# Patient Record
Sex: Male | Born: 1945 | Race: White | Hispanic: No | Marital: Married | Smoking: Former smoker
Health system: Southern US, Community
[De-identification: ages and names within clinical notes are randomized; demographics above are authoritative.]

## PROBLEM LIST (undated history)

## (undated) DIAGNOSIS — D0362 Melanoma in situ of left upper limb, including shoulder: Secondary | ICD-10-CM

## (undated) DIAGNOSIS — C44320 Squamous cell carcinoma of skin of unspecified parts of face: Secondary | ICD-10-CM

## (undated) DIAGNOSIS — D039 Melanoma in situ, unspecified: Secondary | ICD-10-CM

## (undated) DIAGNOSIS — C4492 Squamous cell carcinoma of skin, unspecified: Secondary | ICD-10-CM

## (undated) DIAGNOSIS — D229 Melanocytic nevi, unspecified: Secondary | ICD-10-CM

## (undated) HISTORY — PX: APPENDECTOMY: SHX54

---
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
## 1996-06-04 DIAGNOSIS — D229 Melanocytic nevi, unspecified: Secondary | ICD-10-CM

## 1996-06-04 HISTORY — DX: Melanocytic nevi, unspecified: D22.9

## 2000-02-22 ENCOUNTER — Encounter (INDEPENDENT_AMBULATORY_CARE_PROVIDER_SITE_OTHER): Payer: Self-pay | Admitting: Specialist

## 2000-02-22 ENCOUNTER — Ambulatory Visit (HOSPITAL_COMMUNITY): Admission: RE | Admit: 2000-02-22 | Discharge: 2000-02-22 | Payer: Self-pay | Admitting: *Deleted

## 2003-10-13 DIAGNOSIS — C4492 Squamous cell carcinoma of skin, unspecified: Secondary | ICD-10-CM

## 2003-10-13 DIAGNOSIS — D039 Melanoma in situ, unspecified: Secondary | ICD-10-CM

## 2003-10-13 HISTORY — DX: Squamous cell carcinoma of skin, unspecified: C44.92

## 2003-10-13 HISTORY — DX: Melanoma in situ, unspecified: D03.9

## 2004-05-30 DIAGNOSIS — D0362 Melanoma in situ of left upper limb, including shoulder: Secondary | ICD-10-CM

## 2004-05-30 HISTORY — DX: Melanoma in situ of left upper limb, including shoulder: D03.62

## 2007-07-16 ENCOUNTER — Ambulatory Visit (HOSPITAL_COMMUNITY): Admission: RE | Admit: 2007-07-16 | Discharge: 2007-07-16 | Payer: Self-pay | Admitting: Neurosurgery

## 2007-09-25 ENCOUNTER — Emergency Department (HOSPITAL_COMMUNITY): Admission: EM | Admit: 2007-09-25 | Discharge: 2007-09-25 | Payer: Self-pay | Admitting: Emergency Medicine

## 2007-10-15 ENCOUNTER — Encounter (INDEPENDENT_AMBULATORY_CARE_PROVIDER_SITE_OTHER): Payer: Self-pay | Admitting: Neurosurgery

## 2007-10-15 ENCOUNTER — Inpatient Hospital Stay (HOSPITAL_COMMUNITY): Admission: RE | Admit: 2007-10-15 | Discharge: 2007-10-18 | Payer: Self-pay | Admitting: Neurosurgery

## 2008-01-22 DIAGNOSIS — C4492 Squamous cell carcinoma of skin, unspecified: Secondary | ICD-10-CM

## 2008-01-22 HISTORY — DX: Squamous cell carcinoma of skin, unspecified: C44.92

## 2008-07-23 ENCOUNTER — Encounter: Admission: RE | Admit: 2008-07-23 | Discharge: 2008-07-23 | Payer: Self-pay | Admitting: Neurosurgery

## 2009-04-28 ENCOUNTER — Encounter: Admission: RE | Admit: 2009-04-28 | Discharge: 2009-04-28 | Payer: Self-pay | Admitting: Neurosurgery

## 2009-10-30 IMAGING — CR DG CHEST 2V
3 series · 3 of 3 positions shown · non-contrast
Comparison: None

CLINICAL DATA: History given of pituitary tumor.  History of
hypertension.  History of previous tobacco smoking.

CHEST - 2 VIEW

[view not recorded (1 of 3)]
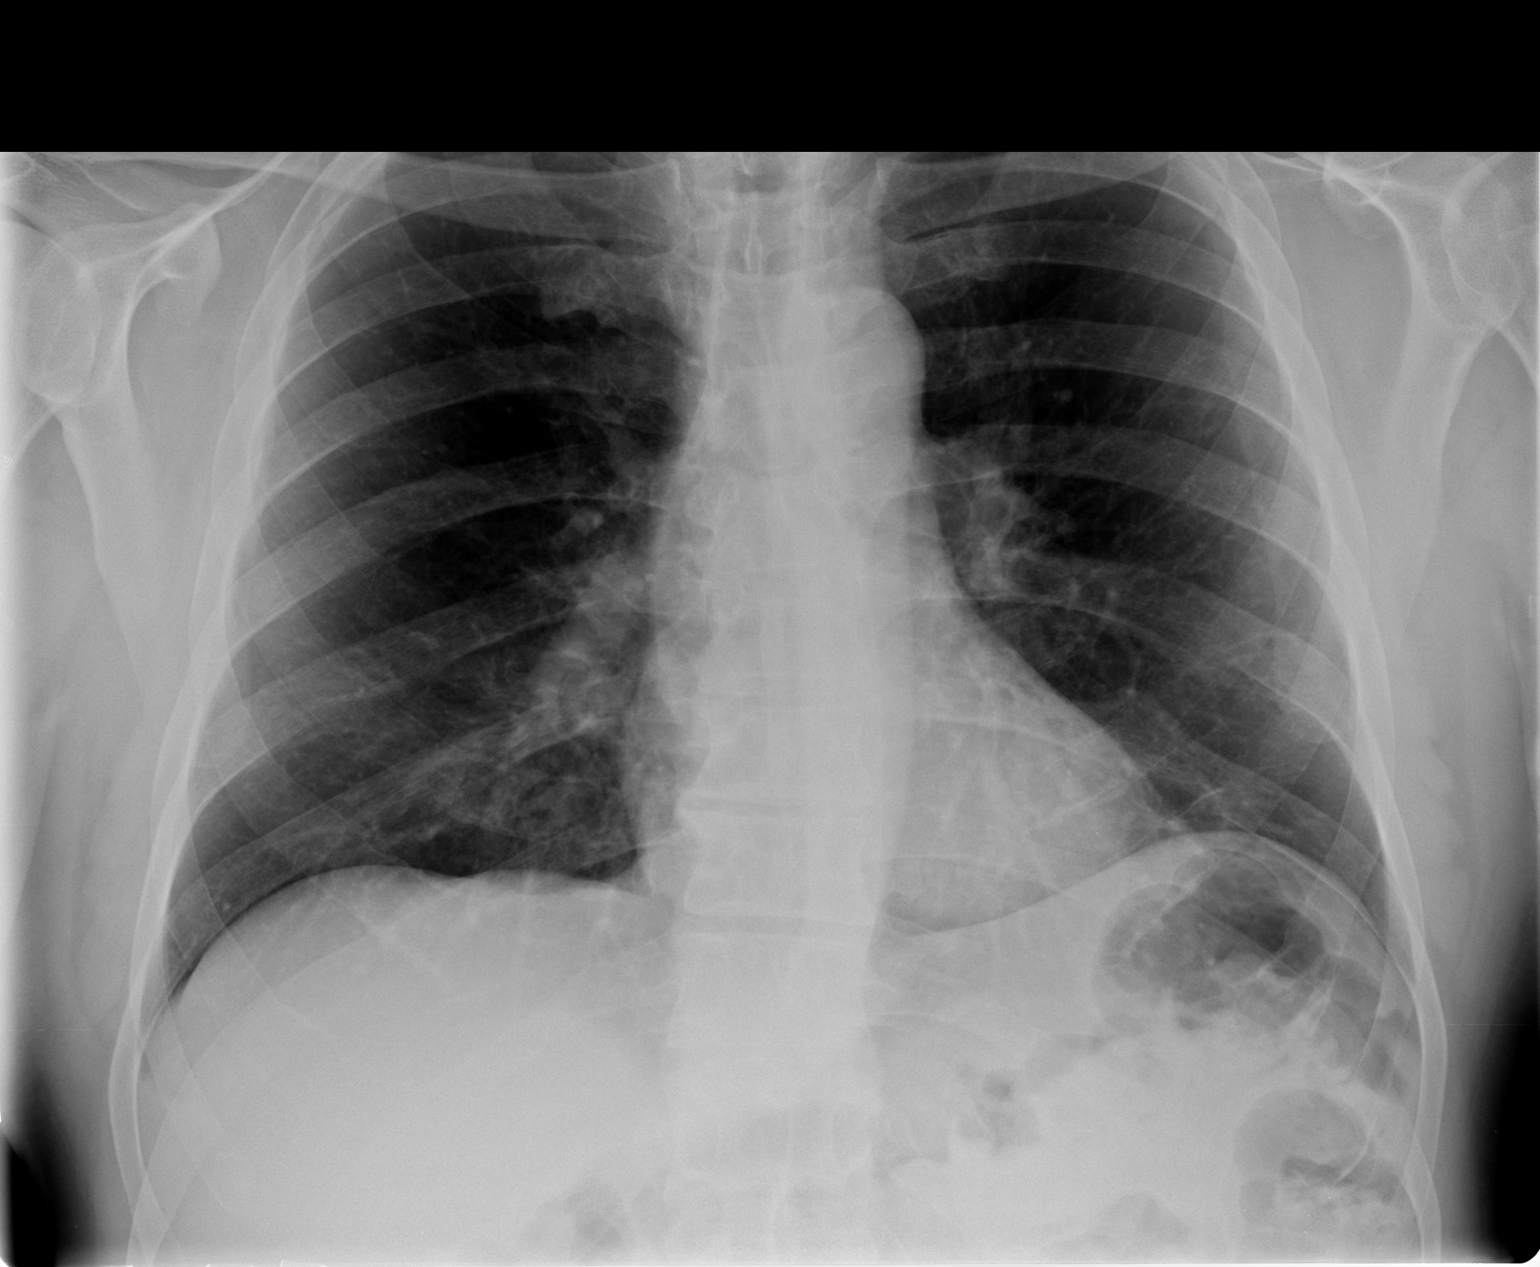

[view not recorded (2 of 3)]
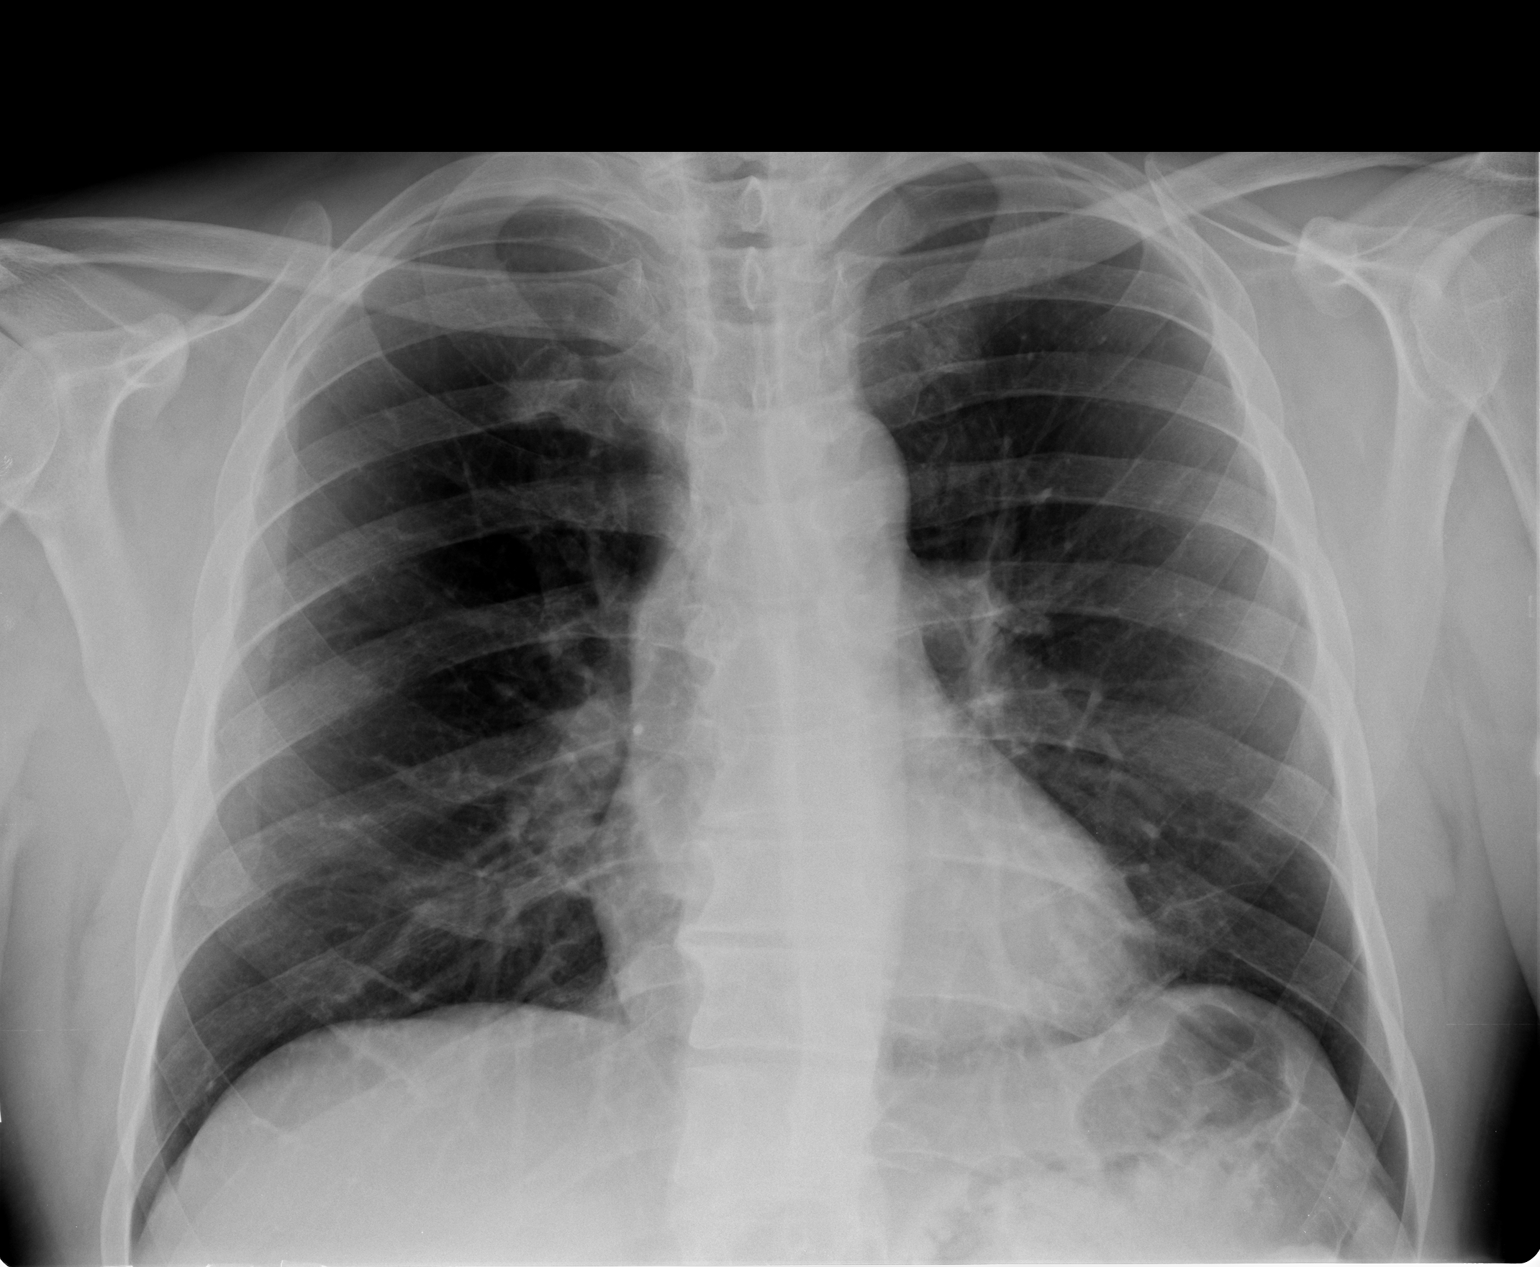

[view not recorded (3 of 3)]
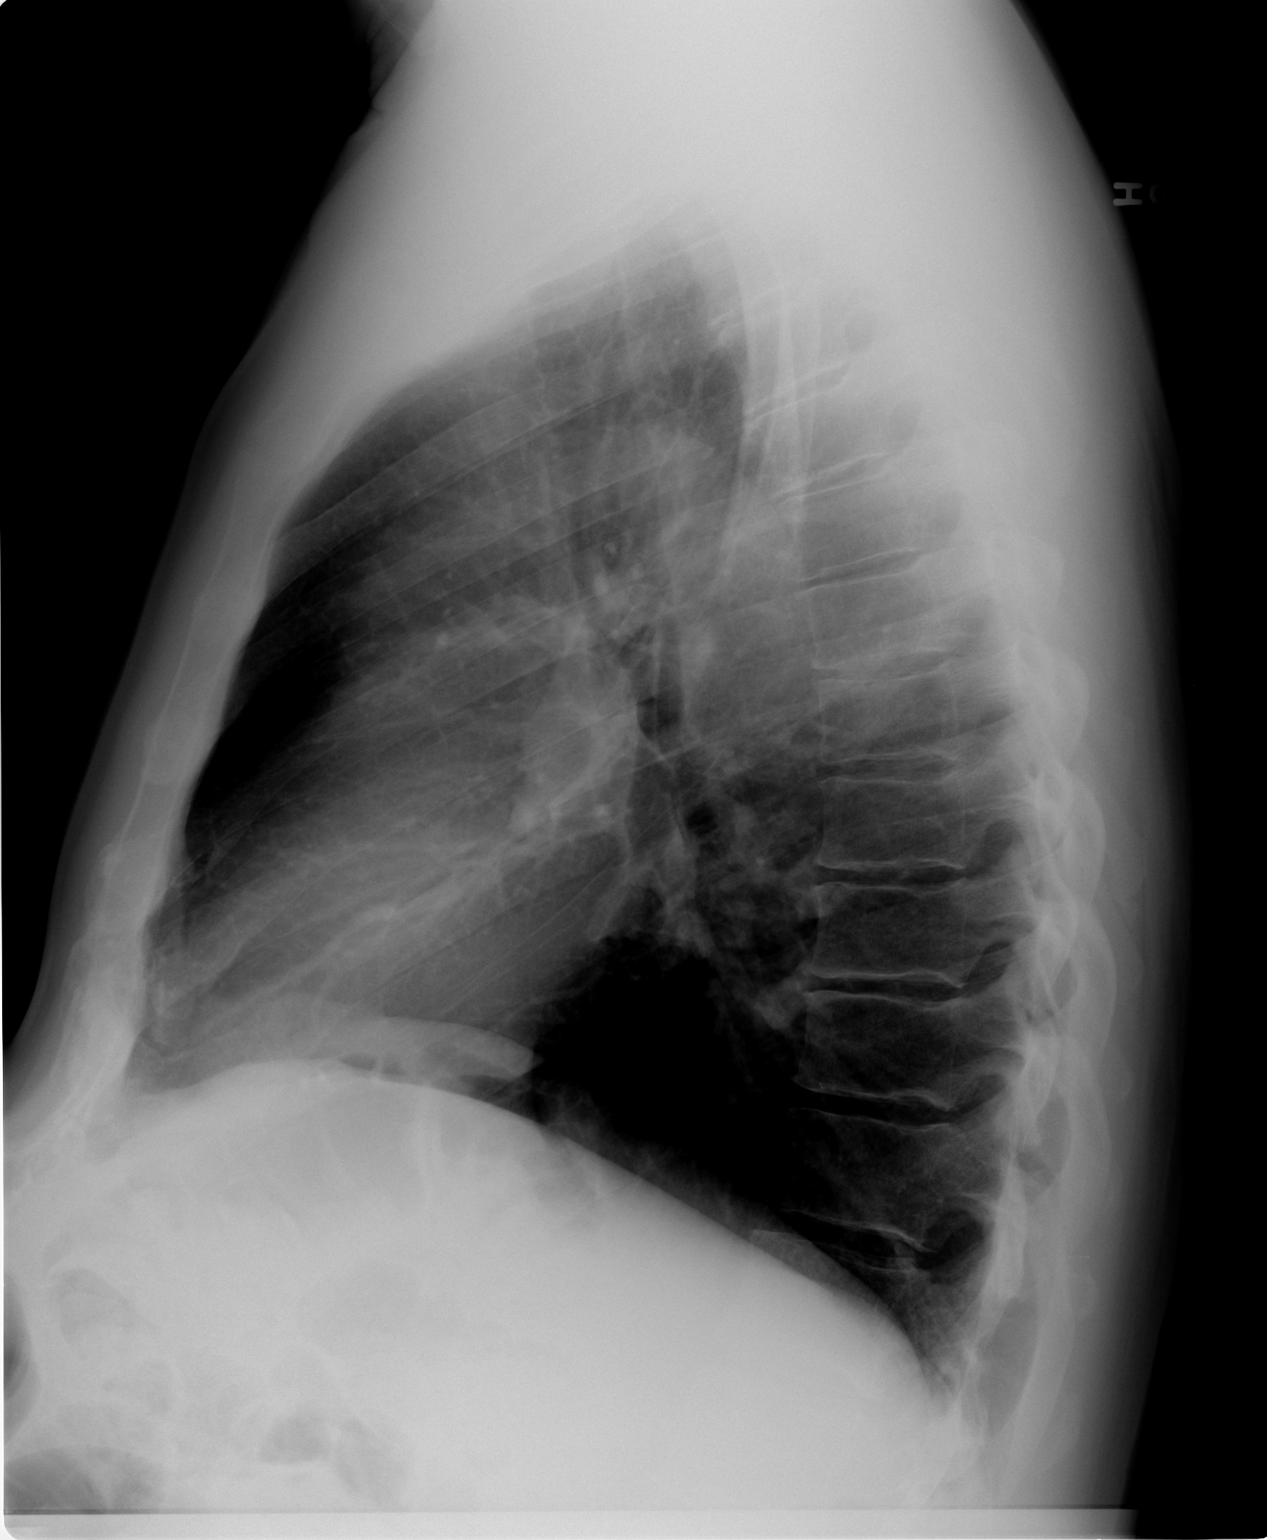

[3 of 3 positions shown; findings below may reference images not displayed]

FINDINGS: Cardiac silhouette is normal size and shape.  The lungs
are free of infiltrates.  No pleural disease is seen.  There is
minimal degenerative spondylosis.
IMPRESSION: No acute or active process seen.

## 2010-03-22 ENCOUNTER — Other Ambulatory Visit: Payer: Self-pay | Admitting: Dermatology

## 2010-06-27 NOTE — Op Note (Signed)
[name], Koch NO.:  [id number]   MEDICAL RECORD NO.:  [id number]          PATIENT TYPE:  INP   LOCATION:  3104                         FACILITY:  [facility]   PHYSICIAN:  Hewitt Shorts, M.D.DATE OF BIRTH:  Aug 10, 1945   DATE OF PROCEDURE:  10/15/2007  DATE OF DISCHARGE:  09/25/2007                               OPERATIVE REPORT   PREOPERATIVE DIAGNOSIS:  Pituitary cystic tumor.   POSTOPERATIVE DIAGNOSIS:  Pituitary cystic tumor.   PROCEDURE:  Transsphenoidal resection of pituitary cystic tumor with  microdissection and harvesting of a fat autograft from the abdominal  wall.   SURGEON:  Hewitt Shorts, MD   ASSISTANT:  Newman Pies, MD   ANESTHESIA:  General endotracheal.   INDICATIONS:  The patient is a 65 year old man who was found to have a  cystic pituitary tumor.  A serial MRI scan showed progressive  enlargement of the tumor with increasing encroachment to the cavernous  sinus bilaterally, right worse than left, as well as into the  suprasellar cistern with the mass abutting the optic chiasm and nerves.  Decision made to proceed with transsphenoidal resection of the tumor.   The procedure was done in a combine fashion between Dr. Suszanne Conners from the  ENT service and myself from the Neurosurgical service.  The approach and  closure was performed by Dr. Suszanne Conners, and the tumor resection was performed  by myself as well as the harvesting of the fat graft.   PROCEDURE:  The patient was brought to the operating room and placed  under general endotracheal anesthesia.  The patient was positioned on  the operating room table with his head resting in a horseshoe headrest  and tilted from right to left and slightly flexed.  This serum  fluoroscope was setup to use intraoperatively as well as the operating  microscope and Dr. Suszanne Conners used the endoscope during his approach.   Dr. Suszanne Conners performed the prep around the face and nares.  The right side  of the abdomen was  shaved and prepped with Betadine soap and solution  and draped in a sterile fashion.   The approach into the sphenoid sinus was performed by Dr. Suszanne Conners and is  dictated separately.  When I entered into the procedure, the operative  microscope was brought into the field and we visualized the sphenoid  sinus and then visualized the anterior wall of the sella turcica.  The  mucosa was stripped from the anterior wall and the bone was markedly  thinned and we were able to use a small micro bone curette to chip away  a small bit of bone and then use a variety of Kerrison punch to remove a  portion of the anterior wall of the sella.  The dura was exposed and we  took a 25-gauge spinal needle and passed it into the sella and gently  aspirated with a 1 mL syringe and aspirated xanthochromic fluid.  This  was sent to pathology for Cytology.   We then draped that area of the right side of the abdomen.  About 3-cm  incision was  made into the subcutaneous tissue, bipolar cautery,  electrocautery to maintain hemostasis and fat graft was harvested to  later be implanted in the area of the tumor resection.  That wound was  checked for hemostasis which was established with the use of cautery and  then the wound was closed.  The subcutaneous and subcuticular were  closed with inverted 2-0 Vicryl sutures.  The skin was closed with  Dermabond.   We then proceeded to open the dura in a cruciate-type fashion.  The cyst  was identified and then using a variety of micro ring curettes, we were  able to strip the lining of the pituitary cystic tumor from the walls of  the cyst and this specimen was sent to pathology in formalin for  permanent examination.  We saw the diaphragma sellae, which was intact,  but gently billowing into the sella.  Once all the walls of the cyst had  been striped of the cyst's lining, the area was irrigated with saline.  Hemostasis was confirmed and then we put a small piece of fat in  the  cystic cavity and then took a small piece of nasal bone, which was  generally positioned within the bone defect that we created along the  anterior wall of the sella and then Dr. Suszanne Conners took back over the surgical  procedure to perform the closure.  The procedure was tolerated well.  The estimated blood loss was 100 mL.  Sponge and needle count were  correct.  Once Dr. Suszanne Conners completes the closure, the patient will be  reversed from the anesthetic, to be extubated, and transferred to the  recovery room for further care.      Hewitt Shorts, M.D.  Electronically Signed     RWN/MEDQ  D:  10/15/2007  T:  10/16/2007  Job:  [id number]   cc:   Hewitt Shorts, M.D.  Newman Pies, MD

## 2010-06-27 NOTE — Discharge Summary (Signed)
[name], [name] NO.:  [id number]   MEDICAL RECORD NO.:  [id number]          PATIENT TYPE:  INP   LOCATION:  3027                         FACILITY:  [facility]   PHYSICIAN:  Hewitt Shorts, M.D.DATE OF BIRTH:  October 09, 1945   DATE OF ADMISSION:  10/15/2007  DATE OF DISCHARGE:  10/18/2007                               DISCHARGE SUMMARY   ADMISSION HISTORY AND PHYSICAL EXAMINATION:  The patient is a 65-year-  old man who is found to have a pituitary tumor.  It is an incidental  finding on an otologic workup, is a cystic lesion extending into the  suprasellar cistern and into the cavernous sinuses bilaterally right  greater than left.  He was followed initially with serial MRI scans, but  these scans showed progressive growth over a 7-9 month period of time  and a decision was made to admit the patient for transsphenoidal  resection of this cystic tumor.  This is to be done in the combined  fashion between Dr. Suszanne Conners of the ENT service and myself.  His exam, both  from a general perspective as well as neurologically was unremarkable  and he had no evidence of endocrinopathy.  Further details of his  admission history and physical examination is included in his admission  note.   HOSPITAL COURSE:  The patient was admitted and underwent transsphenoidal  resection of a cystic pituitary tumor, a small fat graft was used from  the abdomen, and the patient has done very nicely postoperatively.  He  has had no diabetes insipidus.  He was supported perioperatively with  hydrocortisone intravenously, that was tapered and he has tolerated the  tapering discontinuation of the hydrocortisone.  Postoperatively, he has  had no evidence of diabetes insipidus.  Electrolytes have been stable  and he is up, walking actively in the halls with essentially no  discomfort or pain.  His wound is healing nicely with small amount of  drainage, which he is able to manage.  He is scheduled to  follow up with  Dr. Suszanne Conners for suture removal in 4-5 days and he is to return to see me in  the office in about 3 weeks.  Pathology report describes a pituitary  adenoma.   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   Discharge prescription was given by Dr. Suszanne Conners for Keflex to be taken  daily until the nasal splints are removed by Dr. Suszanne Conners.  No other  prescriptions were given.  He is to resume his usual home medications  other than for aspirin, which is to be resumed after the sutures were  removed by Dr. Suszanne Conners.  He is to use Tylenol p.r.n. for pain.      Hewitt Shorts, M.D.  Electronically Signed     RWN/MEDQ  D:  10/18/2007  T:  10/18/2007  Job:  [id number]   cc:   Newman Pies, MD

## 2010-06-27 NOTE — Op Note (Signed)
[name], [name]                 ACCOUNT NO.:  [id number]   MEDICAL RECORD NO.:  [id number]          PATIENT TYPE:  INP   LOCATION:  3104                         FACILITY:  [facility]   PHYSICIAN:  Newman Pies, MD            DATE OF BIRTH:  1945-12-16   DATE OF PROCEDURE:  10/15/2007  DATE OF DISCHARGE:  09/25/2007                               OPERATIVE REPORT   SURGEON:  Newman Pies, MD (in conjunction with Dr. Shirlean Kelly).   PREOPERATIVE DIAGNOSIS:  Pituitary tumor.   POSTOPERATIVE DIAGNOSIS:  Pituitary tumor.   PROCEDURE PERFORMED:  Transsphenoidal resection of pituitary tumor.   ANESTHESIA:  General endotracheal tube anesthesia.   COMPLICATIONS:  None.   ESTIMATED BLOOD LOSS:  100 mL.   INDICATION FOR PROCEDURE:  Mr. Shahin Knierim is a 65 year old white male  with a one-year history of pituitary tumor.  Over the past year, the  tumor was noted to increase in size significantly.  Based on that  findings, the decision was made to surgically remove the enlarging  pituitary tumor.  The risks, benefits, alternatives, and details of the  procedure were reviewed with the patient and his wife.  Questions were  invited and answered.  Informed consent was obtained.   DESCRIPTION:  The patient was taken to the operating room and placed  supine on the operating table.  General endotracheal tube anesthesia was  administered by the anesthesiologist.  Preop IV antibiotic was given.  The patient was then positioned and prepped and draped in the standard  fashion for transseptal/transsphenoidal approach to the pituitary tumor.  Pledgets soaked with Afrin were placed in both nasal cavities for  vasoconstriction.  Lidocaine 1% with 1:100,000 epinephrine were injected  onto the nasal septum bilaterally.  A standard rhinoplasty external  incision was made.  The medial crura of the lower lateral cartilage were  identified and separated at midline.  The nasal septum was subsequently  identified.  The  submucosal perichondrial flap was then elevated on the  left side.  The mucosal flap was then dissected posteriorly, until the  vomer and ethmoid bones were identified.  The subperiosteal flap was  also elevated in the standard fashion.  The bony cartilaginous junction  was subsequently disarticulated.  The ethmoid bone was removed.  A  portion of the septal cartilage was also removed.  The vomer bone was  left in place as a landmark for the midline.  The mucosal flap overlying  the anterior sphenoid wall was subsequently elevated with the Market researcher.  The sphenoid opening was enlarged bilaterally.  The entire  sphenoid cavity was subsequently visualized.  The intersphenoidal septum  was taken down with a rongeur.  At this time, the sella turcica was  visualized.  Visualization of the sella turcica was improved by removing  the entire anterior sphenoidal wall.  A Hardy retractor was then used to  improve the exposure to the sella turcica.  At this time, the care of  the patient was turned over to Dr. Newell Coral.  Please see Dr. Earl Gala  dictation note for details on the tumor excision portion of the case.  Upon completion of the tumor excision, the sphenoid sinus was noted to  be free of any bleeding or CSF leak.  The [facility] retractor was removed.  The previously harvested septal cartilage was replaced.  The septal  mucosal flaps were then quilted with 4-0 plain gut suture on a Keith  needle.  The medial crura of the lower lateral nasal cartilages were  sutured in place with 4-0 chromic sutures.  The rhinoplasty columella  flap was returned to its normal anatomic position and was closed with 5-  0 Prolene sutures.  Doyle splint was subsequently placed and sutured in  place with a 2-0 Prolene suture.  That concluded the procedure for the  patient.  The care of the patient was turned over to the  anesthesiologist.  The patient was awakened from anesthesia without  difficulty.  He was  transferred to the recovery room in good condition.   OPERATIVE FINDINGS:  The Doyle splint will be left in place for 1 week.  The patient will be observed overnight in the Neuro Intensive Care Unit.      Newman Pies, MD  Electronically Signed     ST/MEDQ  D:  10/15/2007  T:  10/16/2007  Job:  [id number]

## 2010-06-27 NOTE — H&P (Signed)
NAMEWENDLE, [name] NO.:  [id number]   MEDICAL RECORD NO.:  [id number]          PATIENT TYPE:  INP   LOCATION:  3104                         FACILITY:  [facility]   PHYSICIAN:  Hewitt Shorts, M.D.DATE OF BIRTH:  1945/03/20   DATE OF ADMISSION:  10/15/2007  DATE OF DISCHARGE:                              HISTORY & PHYSICAL   HISTORY OF PRESENT ILLNESS:  The patient is a 65 year old right-handed  white male who was evaluated for a pituitary mass.  He had hearing loss  following his left ear  being struck by a tennis ball while playing  doubles.  He underwent MRI by his otologist which found pituitary mass  and the patient was evaluated for this.  He denies any visual  alteration, fatigue, tiredness, weight loss, and skin bruising.  His  weight has been stable.  He has had some erectile dysfunction with  asymmetric engorgement of the penis with erection, as such the right  side engorged and the left side does not and therefore we gave  anesthesia to the left.  He had no evaluation for this by his urologist  or primary physician.   We performed an endocrinologic workup and found no endocrinopathy.  We  followed the patient with serial MRI scans.  Those revealed progressive  enlargement of this pituitary mass, appears cystic with peripheral  enhancement and is now abutting the optic chiasm extending into the  cavernous sinuses bilaterally, right worse than left.  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  We did  obtain a CT angiogram to rule out a cerebral aneurysm and the CT  angiogram showed no evidence of aneurysm.   The patient is admitted now for a transsphenoidal resection of his  pituitary mass.   PAST MEDICAL HISTORY:  Notable for history of hypertension,  hypercholesterolemia, and melanoma.  No history of myocardial  infarction, stroke, diabetes, peptic ulcer disease, or lung disease.   PREVIOUS SURGICAL  HISTORY:  Tonsillectomy in 1931, and resection of  multiple melanomas in 2006 and 2007.   He denies allergies to medications.   CURRENT MEDICATIONS:  1. Diovan/hydrochlorothiazide 80/12.5 daily.  2. Simvastatin 20 daily.  3. Aspirin 81 mg daily.   FAMILY HISTORY:  Parents passed on in elderly age.   SOCIAL HISTORY:  The patient is a retired Consulting civil engineer for  [employer].  He is married.  He does not smoke, he  has occasional glass of beer or wine on most days.  He denies history of  substance abuse.   REVIEW OF SYSTEMS:  Notable for those described in the history of  present illness and past medical history, but otherwise is unremarkable.   PHYSICAL EXAMINATION:  GENERAL:  The patient is a well-developed, well-  nourished white male in no acute distress.  VITAL SIGNS:  Temp is 97.9, pulse 55, blood pressure 147/93, respiratory  rate 18, height 6 feet, and weight 96 kg.  LUNGS:  Clear to auscultation.  He has symmetrical respiratory  excursion.  HEART:  Regular rate and  rhythm.  Normal S1 and S2.  There is no murmur.  ABDOMEN:  Soft and nondistended, bowel sounds are present.  EXTREMITIES:  No clubbing, cyanosis, or edema.  NEUROLOGIC:  The patient to be awake, alert, and fully oriented.  Speech  is fluent.  He has good comprehension.  Cranial nerves show pupils are  equal, round, and reactive to light.  Extraocular movements are intact.  Visual fields are intact to confrontation with a red target.  Facial  movement is symmetrical.  Facial sensation is intact.  Motor examination  shows 5/5 strength to the upper and lower extremities.  Sensation is  intact to pinprick in the upper and lower extremities.  Reflexes were  absent in the biceps, brachioradialis, triceps, quadriceps, and  gastrocnemius.  They are symmetrical bilaterally.  Toes are downgoing  bilaterally.  He has a normal gait and stance.   IMPRESSION:  Enlarging pituitary mass that appears  cystic.  He does not  have evidence of endocrinopathy.  The patient is admitted now for a  transsphenoidal resection of this pituitary mass in a combined fashion  between the ENT Service, Dr. Suszanne Conners, and myself from the Neurosurgical  Service.   PLAN:  The patient admitted for transsphenoidal resection of a pituitary  tumor in a combined fashion between the Neurosurgical and ENT Services.  We discussed the nature of this lesion, the nature of surgery, typical  length of surgery, hospital stay, and overall recuperation.  Need for  postoperative care in the intensive care unit and risk surgery including  risk of infection, including meningitis and/or sinusitis, bleeding,  possibly transfusion.  The risk is neurologic dysfunction including loss  of vision, paralysis, and death.  The risk of endocrinopathy possibly  for long term/lifetime pituitary hormone replacement and anesthetic  risk, myocardial infarction, stroke, pneumonia, and death.  We also  discussed the risk of occurrence of this lesion and possibly further  surgery, radiation therapy, or medical therapy.  Understanding all this,  he does wish to go ahead with surgery and is admitted for such.      Hewitt Shorts, M.D.  Electronically Signed     RWN/MEDQ  D:  10/15/2007  T:  10/15/2007  Job:  [id number]   cc:   Newman Pies, MD

## 2010-07-28 ENCOUNTER — Other Ambulatory Visit: Payer: Self-pay | Admitting: Neurosurgery

## 2010-07-28 DIAGNOSIS — D353 Benign neoplasm of craniopharyngeal duct: Secondary | ICD-10-CM

## 2010-08-23 ENCOUNTER — Ambulatory Visit
Admission: RE | Admit: 2010-08-23 | Discharge: 2010-08-23 | Disposition: A | Discharge status: Home / Self Care | Payer: Medicare Other | Source: Ambulatory Visit | Attending: Neurosurgery | Admitting: Neurosurgery

## 2010-08-23 DIAGNOSIS — D352 Benign neoplasm of pituitary gland: Secondary | ICD-10-CM

## 2010-08-23 MED ORDER — GADOBENATE DIMEGLUMINE 529 MG/ML IV SOLN: 15.0000 mL | Freq: Once | INTRAVENOUS | Status: AC | PRN

## 2010-11-09 LAB — CREATININE, SERUM
Creatinine, Ser: 0.98
GFR calc Af Amer: >60
GFR calc non Af Amer: >60

## 2010-11-09 LAB — BUN: BUN: 17

## 2010-11-10 LAB — DIFFERENTIAL
Eosinophils Relative: 2
Lymphocytes Relative: 35
Neutrophils Relative %: 55

## 2010-11-10 LAB — CBC
HCT: 39.3
Hemoglobin: 13.2
MCHC: 33.7
MCV: 89.6
RBC: 4.39

## 2010-11-10 LAB — POCT I-STAT, CHEM 8
BUN: 13
Creatinine, Ser: 0.9
HCT: 38 — ABNORMAL LOW
Hemoglobin: 12.9 — ABNORMAL LOW
Potassium: 4.2

## 2010-11-15 LAB — BASIC METABOLIC PANEL
BUN: 10
BUN: 16
BUN: 16
CO2: 25
CO2: 25
CO2: 27
CO2: 27
Calcium: 8.6
Calcium: 8.7
Calcium: 8.8
Calcium: 9
Chloride: 104
Chloride: 106
Chloride: 107
Chloride: 109
Creatinine, Ser: 0.81
Creatinine, Ser: 0.95
Creatinine, Ser: 0.96
GFR calc Af Amer: >60
GFR calc Af Amer: >60
GFR calc Af Amer: >60
GFR calc Af Amer: >60
GFR calc non Af Amer: >60
GFR calc non Af Amer: >60
GFR calc non Af Amer: >60
GFR calc non Af Amer: >60
Glucose, Bld: 106 — ABNORMAL HIGH
Glucose, Bld: 130 — ABNORMAL HIGH
Glucose, Bld: 152 — ABNORMAL HIGH
Glucose, Bld: 160 — ABNORMAL HIGH
Potassium: 3.5
Potassium: 3.5
Potassium: 3.7
Sodium: 139
Sodium: 139
Sodium: 140
Sodium: 141

## 2013-02-24 ENCOUNTER — Other Ambulatory Visit: Payer: Self-pay | Admitting: Dermatology

## 2013-07-01 ENCOUNTER — Encounter (INDEPENDENT_AMBULATORY_CARE_PROVIDER_SITE_OTHER): Payer: Self-pay | Admitting: General Surgery

## 2013-07-01 ENCOUNTER — Ambulatory Visit (INDEPENDENT_AMBULATORY_CARE_PROVIDER_SITE_OTHER): Payer: Medicare Other | Admitting: General Surgery

## 2013-07-01 VITALS — BP 162/88 | HR 76 | Temp 97.5°F | Resp 12 | Ht 72.0 in | Wt 198.8 lb

## 2013-07-01 DIAGNOSIS — M6200 Separation of muscle (nontraumatic), unspecified site: Secondary | ICD-10-CM

## 2013-07-01 DIAGNOSIS — M6208 Separation of muscle (nontraumatic), other site: Secondary | ICD-10-CM | POA: Insufficient documentation

## 2013-07-01 NOTE — Progress Notes (Signed)
Patient ID: [name], male   DOB: 11-Sep-1945, 68 y.o.   MRN: [id number]  Chief Complaint  Patient presents with  . New Evaluation    eval ventral hernia    HPI [name] is a 68 y.o. male.  Chief complaint: possible hernia HPI Patient was noted by Dr. Einar Gip to have a possible abdominal wall hernia. He asked me to see him in consultation. The patient notices a bulge along his upper midline with activity. No pain. No GI symptoms. He is active and plays tennis. He has not had abdominal surgery there. History reviewed. No pertinent past medical history.  Past Surgical History  Procedure Laterality Date  . Appendectomy      History reviewed. No pertinent family history.  Social History History  Substance Use Topics  . Smoking status: Former Smoker    Quit date: 07/02/1967  . Smokeless tobacco: Former Systems developer    Quit date: 07/01/1969  . Alcohol Use: Yes     Comment: occ    No Known Allergies  Current Outpatient Prescriptions  Medication Sig Dispense Refill  . flecainide (TAMBOCOR) 50 MG tablet       . simvastatin (ZOCOR) 40 MG tablet       . valsartan (DIOVAN) 80 MG tablet        No current facility-administered medications for this visit.    Review of Systems Review of Systems  Constitutional: Negative.   HENT: Negative.   Eyes: Negative.   Respiratory: Negative.   Cardiovascular:       A fib  Gastrointestinal:       See HPI  Endocrine: Negative.   Genitourinary: Negative.   Musculoskeletal: Negative.   Skin: Negative.   Allergic/Immunologic: Negative.   Neurological: Negative.   Hematological: Negative.   Psychiatric/Behavioral: Negative.     Blood pressure 162/88, pulse 76, temperature 97.5 F (36.4 C), resp. rate 12, height 6' (1.829 m), weight 198 lb 12.8 oz (90.175 kg).  Physical Exam Physical Exam  Constitutional: He is oriented to person, place, and time. He appears well-developed and well-nourished.  HENT:  Head: Normocephalic and  atraumatic.  Eyes: EOM are normal. Pupils are equal, round, and reactive to light.  Neck: Normal range of motion. No tracheal deviation present.  Cardiovascular:  irreg irreg  Pulmonary/Chest: Effort normal and breath sounds normal. No stridor. No respiratory distress. He has no wheezes.  Abdominal: Soft. He exhibits no distension. There is no tenderness. There is no rebound and no guarding.  Upper midline rectus diastasis  Musculoskeletal: Normal range of motion.  Neurological: He is alert and oriented to person, place, and time.  Skin: Skin is warm and dry.    Data Reviewed Referral and notes from Dr. Einar Gip  Assessment    Rectus diastasis    Plan    This is not dangerous and is not a hernia per se. It is considered a cosmetic condition. He is not symptomatic. I advise wearing the brace that he uses for comfort. He does not want surgery at this time. If he changes hi mind, he will need to see a plastic surgeon as this is considered a cosmetic procedure.       Zenovia Jarred 07/01/2013, 9:42 AM

## 2015-03-01 DIAGNOSIS — C44320 Squamous cell carcinoma of skin of unspecified parts of face: Secondary | ICD-10-CM

## 2015-03-01 HISTORY — DX: Squamous cell carcinoma of skin of unspecified parts of face: C44.320

## 2015-08-25 DIAGNOSIS — I10 Essential (primary) hypertension: Secondary | ICD-10-CM | POA: Insufficient documentation

## 2015-08-25 DIAGNOSIS — E785 Hyperlipidemia, unspecified: Secondary | ICD-10-CM | POA: Insufficient documentation

## 2018-01-21 ENCOUNTER — Other Ambulatory Visit: Payer: Self-pay | Admitting: Dermatology

## 2018-08-05 ENCOUNTER — Other Ambulatory Visit: Payer: Self-pay | Admitting: Cardiology

## 2018-08-05 NOTE — Telephone Encounter (Signed)
Please fill

## 2018-09-25 ENCOUNTER — Other Ambulatory Visit: Payer: Self-pay | Admitting: Cardiology

## 2018-09-27 ENCOUNTER — Other Ambulatory Visit: Payer: Self-pay | Admitting: Cardiology

## 2019-01-26 ENCOUNTER — Other Ambulatory Visit: Payer: Self-pay | Admitting: Cardiology

## 2019-01-27 LAB — COMPREHENSIVE METABOLIC PANEL
ALT: 27 IU/L (ref 0–44)
AST: 25 IU/L (ref 0–40)
Albumin/Globulin Ratio: 2.2 (ref 1.2–2.2)
Albumin: 4.7 g/dL (ref 3.7–4.7)
Alkaline Phosphatase: 46 IU/L (ref 39–117)
BUN/Creatinine Ratio: 15 (ref 10–24)
BUN: 14 mg/dL (ref 8–27)
Bilirubin Total: 0.5 mg/dL (ref 0.0–1.2)
CO2: 24 mmol/L (ref 20–29)
Calcium: 9.6 mg/dL (ref 8.6–10.2)
Chloride: 103 mmol/L (ref 96–106)
Creatinine, Ser: 0.91 mg/dL (ref 0.76–1.27)
GFR calc Af Amer: 95 mL/min/{1.73_m2} (ref >59)
GFR calc non Af Amer: 82 mL/min/{1.73_m2} (ref >59)
Globulin, Total: 2.1 g/dL (ref 1.5–4.5)
Glucose: 95 mg/dL (ref 65–99)
Potassium: 4.7 mmol/L (ref 3.5–5.2)
Sodium: 139 mmol/L (ref 134–144)
Total Protein: 6.8 g/dL (ref 6.0–8.5)

## 2019-01-27 LAB — CBC WITH DIFFERENTIAL/PLATELET
Basophils Absolute: 0.1 10*3/uL (ref 0.0–0.2)
Basos: 1 %
EOS (ABSOLUTE): 0.1 10*3/uL (ref 0.0–0.4)
Eos: 1 %
Hematocrit: 43.2 % (ref 37.5–51.0)
Hemoglobin: 14.2 g/dL (ref 13.0–17.7)
Immature Grans (Abs): 0 10*3/uL (ref 0.0–0.1)
Immature Granulocytes: 0 %
Lymphocytes Absolute: 2.8 10*3/uL (ref 0.7–3.1)
Lymphs: 36 %
MCH: 29.7 pg (ref 26.6–33.0)
MCHC: 32.9 g/dL (ref 31.5–35.7)
MCV: 90 fL (ref 79–97)
Monocytes Absolute: 0.6 10*3/uL (ref 0.1–0.9)
Monocytes: 8 %
Neutrophils Absolute: 4.1 10*3/uL (ref 1.4–7.0)
Neutrophils: 54 %
Platelets: 292 10*3/uL (ref 150–450)
RBC: 4.78 x10E6/uL (ref 4.14–5.80)
RDW: 12.1 % (ref 11.6–15.4)
WBC: 7.7 10*3/uL (ref 3.4–10.8)

## 2019-01-27 LAB — LIPID PANEL W/O CHOL/HDL RATIO
Cholesterol, Total: 162 mg/dL (ref 100–199)
HDL: 71 mg/dL (ref >39)
LDL Chol Calc (NIH): 74 mg/dL (ref 0–99)
Triglycerides: 95 mg/dL (ref 0–149)
VLDL Cholesterol Cal: 17 mg/dL (ref 5–40)

## 2019-01-29 ENCOUNTER — Encounter: Payer: Self-pay | Admitting: Cardiology

## 2019-02-02 ENCOUNTER — Encounter: Payer: Self-pay | Admitting: Cardiology

## 2019-02-02 ENCOUNTER — Other Ambulatory Visit: Payer: Self-pay

## 2019-02-02 ENCOUNTER — Ambulatory Visit (INDEPENDENT_AMBULATORY_CARE_PROVIDER_SITE_OTHER): Payer: Medicare Other | Admitting: Cardiology

## 2019-02-02 VITALS — BP 172/88 | HR 62 | Temp 97.0°F | Ht 72.0 in | Wt 200.2 lb

## 2019-02-02 DIAGNOSIS — R9431 Abnormal electrocardiogram [ECG] [EKG]: Secondary | ICD-10-CM

## 2019-02-02 DIAGNOSIS — E7800 Pure hypercholesterolemia, unspecified: Secondary | ICD-10-CM

## 2019-02-02 DIAGNOSIS — I10 Essential (primary) hypertension: Secondary | ICD-10-CM

## 2019-02-02 DIAGNOSIS — Z8679 Personal history of other diseases of the circulatory system: Secondary | ICD-10-CM

## 2019-02-02 MED ORDER — VALSARTAN-HYDROCHLOROTHIAZIDE 160-12.5 MG PO TABS: 1.0000 | ORAL_TABLET | ORAL | 2 refills | Status: DC

## 2019-02-02 NOTE — Progress Notes (Signed)
Primary Physician/Referring:  Christain Sacramento, MD  Patient ID: [name], male    DOB: 1945/09/18, 73 y.o.   MRN: [id number]  Chief Complaint  Patient presents with  . Hypertension  . Hyperlipidemia  . Atrial Fibrillation   HPI:    [name]  is a 73 y.o. Caucasian male with  h/o remote A. Fibrillation  In 2013 without recurrence and maintains sinus on Flecainide 50 mg BID chronically. (converted to sinus on Flecainide and metoprolol and discontinued excessive coffee drinking). He reports he is doing very well, staying very active and playing tennis mulitple times per week. He has not had recurrence of Afib since using flecainide on low dose. He denies any more episodes of palpitations and maintains a high energy level.   He denies any chest pain, shortness of breath, PND, orthopnea, pedal edema, or syncope. His main concern today is uncontrolled blood pressure recently.  Otherwise remains asymptomatic.   Past Medical History:  Diagnosis Date  . 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 Atypical nevus 03/22/1998   moderate atypia - upper mid back (widershave)  . Atypical nevus 03/14/1999   slight atypia - left flank  . Atypical nevus 01/01/2006   moderate atypia - left outer scapula  . Atypical nevus 02/03/2007   slight atypia - left chest  . Atypical nevus 06/02/2008   atypical proliferation - left scapula - excision  . 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 Squamous cell carcinoma of skin 10/13/2003   right cheek - treated10/07/2003  . Squamous cell carcinoma of skin 06/05/2005   left collarbone - MOHs  . Squamous cell carcinoma of skin  12/15/2008   top right ear - CX3 + 5FU  . 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 APPENDECTOMY     Social History   Socioeconomic History  . Marital status: Married    Spouse name: Not on file  . Number of children: 0  . Years of education: Not on file  . Highest education level: Not on file  Occupational History  . Not on file  Tobacco Use  . Smoking status: Former Smoker    Packs/day: 1.00    Years: 17.00    Pack years: 17.00    Types: Cigarettes    Quit date: 07/02/1967    Years since quitting: 51.6  . Smokeless tobacco: Former Systems developer    Quit date: 07/01/1969  Substance and Sexual Activity  . Alcohol use: Yes    Comment: occ  . Drug use: No  . Sexual activity: Not on file  Other Topics Concern  . Not on file  Social History Narrative  . Not on file   Social Determinants of Health   Financial Resource Strain:   . Difficulty of Paying Living Expenses: Not on file  Food Insecurity:   . Worried About Charity fundraiser in the Last Year: Not on file  .  Ran Out of Food in the Last Year: Not on file  Transportation Needs:   . Lack of Transportation (Medical): Not on file  . Lack of Transportation (Non-Medical): Not on file  Physical Activity:   . Days of Exercise per Week: Not on file  . Minutes of Exercise per Session: Not on file  Stress:   . Feeling of Stress : Not on file  Social Connections:   . Frequency of Communication with Friends and Family: Not on file  . Frequency of Social Gatherings with Friends and Family: Not on file  . Attends Religious Services: Not on file  . Active Member of Clubs or Organizations: Not on file  . Attends Archivist Meetings: Not on file  . Marital Status: Not on file  Intimate Partner Violence:   . Fear of Current or Ex-Partner: Not on file  . Emotionally  Abused: Not on file  . Physically Abused: Not on file  . Sexually Abused: Not on file   ROS  Review of Systems  Constitution: Negative for chills, decreased appetite, malaise/fatigue and weight gain.  Cardiovascular: Negative for dyspnea on exertion, leg swelling and syncope.  Endocrine: Negative for cold intolerance.  Hematologic/Lymphatic: Does not bruise/bleed easily.  Musculoskeletal: Negative for joint swelling.  Gastrointestinal: Negative for abdominal pain, anorexia, change in bowel habit, hematochezia and melena.  Neurological: Negative for headaches and light-headedness.  Psychiatric/Behavioral: Negative for depression and substance abuse.  All other systems reviewed and are negative.  Objective  Blood pressure (!) 172/88, pulse 62, temperature (!) 97 F (36.1 C), height 6' (1.829 m), weight 200 lb 3.2 oz (90.8 kg), SpO2 99 %.  Vitals with BMI 02/02/2019 07/01/2013  Height _0  _1   Weight 200 lbs 3 oz 198 lbs 13 oz  BMI 95.09 27  Systolic 326 712  Diastolic 88 88  Pulse 62 76     Physical Exam  HENT:  Head: Atraumatic.  Eyes: Conjunctivae are normal.  Neck: No JVD present. No thyromegaly present.  Cardiovascular: Normal rate, regular rhythm, normal heart sounds and intact distal pulses. Exam reveals no gallop.  No murmur heard. No leg edema, no JVD.  Pulmonary/Chest: Effort normal and breath sounds normal.  Abdominal: Soft. Bowel sounds are normal.  Musculoskeletal:        General: Normal range of motion.     Cervical back: Neck supple.  Neurological: He is alert.  Skin: Skin is warm and dry.  Psychiatric: He has a normal mood and affect.   Laboratory examination:   Recent Labs    01/26/19 1119  NA 139  K 4.7  CL 103  CO2 24  GLUCOSE 95  BUN 14  CREATININE 0.91  CALCIUM 9.6  GFRNONAA 82  GFRAA 95   estimated creatinine clearance is 77 mL/min (by C-G formula based on SCr of 0.91 mg/dL).  CMP Latest Ref Rng & Units 01/26/2019 10/18/2007 10/17/2007    Glucose 65 - 99 mg/dL 95 106(H) 130(H)  BUN 8 - 27 mg/dL _2 Creatinine 0.76 - 1.27 mg/dL 0.91 0.81 0.96  Sodium 134 - 144 mmol/L 139 141 140  Potassium 3.5 - 5.2 mmol/L 4.7 3.7 3.5  Chloride 96 - 106 mmol/L 103 109 106  CO2 20 - 29 mmol/L _3 Calcium 8.6 - 10.2 mg/dL 9.6 8.8 9.0  Total Protein 6.0 - 8.5 g/dL 6.8 - -  Total Bilirubin 0.0 - 1.2 mg/dL 0.5 - -  Alkaline Phos 39 - 117  IU/L 46 - -  AST 0 - 40 IU/L 25 - -  ALT 0 - 44 IU/L 27 - -   CBC Latest Ref Rng & Units 01/26/2019 09/25/2007 09/25/2007  WBC 3.4 - 10.8 x10E3/uL 7.7 - 8.7  Hemoglobin 13.0 - 17.7 g/dL 14.2 12.9(L) 13.2  Hematocrit 37.5 - 51.0 % 43.2 38.0(L) 39.3  Platelets 150 - 450 x10E3/uL 292 - 225   Lipid Panel     Component Value Date/Time   CHOL 162 01/26/2019 1119   TRIG 95 01/26/2019 1119   HDL 71 01/26/2019 1119   LDLCALC 74 01/26/2019 1119   HEMOGLOBIN A1C No results found for: HGBA1C, MPG TSH No results for input(s): TSH in the last 8760 hours.  Labs 02/04/2018: Total cholesterol 187, triglycerides 95, HDL 57, LDL 111.  HB 14.6/HCT 42.6, platelets 280, normal indicis.  Serum glucose 101 mg, BUN 15, creatinine 0.99, eGFR greater than 60 mL, potassium 5.0.  CMP otherwise normal.   Medications and allergies  No Known Allergies   Current Outpatient Medications  Medication Instructions  . flecainide (TAMBOCOR) 50 MG tablet 2 times daily, 1 in the AM ; 1/2 PM  . simvastatin (ZOCOR) 40 MG tablet TAKE ONE TABLET BY MOUTH AT BEDTIME  . valsartan-hydrochlorothiazide (DIOVAN HCT) 160-12.5 MG tablet 1 tablet, Oral, BH-each morning    Radiology:  No results found.  Cardiac Studies:   Stress EKG 01/23/11: Normal stress EKG. ST dep back to baseline with exercise of 2.5 mm back to baseline at 1: 40 minutes into recovery. 7 minutes and 10 METs.   Echocardiogram 01/08/2017: Left ventricle cavity is normal in size. Mild concentric hypertrophy of the left ventricle. Normal global wall motion.  Calculated EF 61%. Left atrial cavity is mildly dilated. Mild (Grade I) mitral regurgitation. No significant change compared to prior study in 2012.  Assessment     ICD-10-CM   1. Essential hypertension  I10 valsartan-hydrochlorothiazide (DIOVAN HCT) 160-12.5 MG tablet  2. Pure hypercholesterolemia  E78.00   3. History of atrial fibrillation without current medication 2013  Z86.79 EKG 12-Lead   CHA2DS2-VASc Score is 3.  Yearly risk of stroke: 3.2% (A, HTN)     EKG 02/02/2019: Sinus rhythm with first-degree AV block at rate of 61 bpm, leftward axis, poor R-wave progression, cannot exclude anteroseptal infarct old.  IVCD, atypical LBBB.  Consider LVH. No significant change from EKG 01/30/2018: Sinus rhythm with first-degree AV block at the rate of 65 bpm, left atrial enlargement, atypical left bundle branch block.  Recommendations:   Meds ordered this encounter  Medications  . valsartan-hydrochlorothiazide (DIOVAN HCT) 160-12.5 MG tablet    Sig: Take 1 tablet by mouth every morning.    Dispense:  30 tablet    Refill:  2    [name]  is a 73 y.o. Caucasian male with  h/o remote A. Fibrillation  In 2013 without recurrence and maintains sinus on Flecainide 50 mg BID chronically. (converted to sinus on Flecainide and metoprolol and discontinued excessive coffee drinking).   He is presently doing well, essentially remains asymptomatic without any palpitations, dyspnea, dizziness.  He is abnormal EKG however this is remained stable for many years.  With regard to hypertension, he has noticed established be uncontrolled at home, I will change valsartan 80 mg to valsartan HCT 160/12.5 mg in the morning.  He has close to 50-60 tablets of valsartan plain, he will continue to use them to tablet q. daily.  I will like to see him back  in 2 months for hypertension follow-up, he has not seen his PCP which I encouraged him to keep an appointment.  I reviewed his labs, lipids are under excellent  control, renal function is normal.  If he remains stable with regard to blood pressure control, then I'll see him back on a six-month basis.    He does not want to be on anticoagulation as he has not had any recurrence of atrial fibrillation.  Last year his cardioembolic risk was 2.0 and we could've argued to not anticoagulate but the shears him being 73 years of age, he probably should be on anticoagulation however with no recurrence and left atrial size being normal, patient preference, will continue to monitor and have a very low threshold to start anticoagulation if she has any palpitations.  Adrian Prows, MD, [facility] 02/02/2019, 12:22 PM [facility] [place] Pager: [phone] Office: [phone]

## 2019-03-28 ENCOUNTER — Other Ambulatory Visit: Payer: Self-pay | Admitting: Cardiology

## 2019-04-01 NOTE — Telephone Encounter (Signed)
Yes, but need to clarify dosage with the patient

## 2019-04-06 ENCOUNTER — Encounter: Payer: Self-pay | Admitting: Cardiology

## 2019-04-06 ENCOUNTER — Ambulatory Visit: Payer: Medicare PPO | Admitting: Cardiology

## 2019-04-06 ENCOUNTER — Other Ambulatory Visit: Payer: Self-pay

## 2019-04-06 VITALS — BP 142/82 | HR 68 | Temp 96.2°F | Resp 14 | Ht 72.0 in | Wt 204.7 lb

## 2019-04-06 DIAGNOSIS — Z8679 Personal history of other diseases of the circulatory system: Secondary | ICD-10-CM

## 2019-04-06 DIAGNOSIS — I10 Essential (primary) hypertension: Secondary | ICD-10-CM

## 2019-04-06 MED ORDER — VALSARTAN-HYDROCHLOROTHIAZIDE 320-12.5 MG PO TABS: 1.0000 | ORAL_TABLET | Freq: Every day | ORAL | 1 refills | Status: DC

## 2019-04-06 NOTE — Telephone Encounter (Signed)
Patient says that he takes 50mg  every morning and splits 1/2 (25mg ) tablet at bedtime. I asked him if he asked you to fill it today when he came in, and he said, "I thought it was already done". I explained that we needed clarification on how he takes it. He said that he has taken it like this for 3 years. If this has not been filled, he is ok with you filling in the morning.

## 2019-04-06 NOTE — Telephone Encounter (Signed)
Please follow up on this

## 2019-04-06 NOTE — Progress Notes (Signed)
Primary Physician/Referring:  Christain Sacramento, MD  Patient ID: [name], male    DOB: 04-19-1945, 74 y.o.   MRN: [id number]  Chief Complaint  Patient presents with  . Hypertension    90monthfollow up   HPI:    [name] is a 74y.o. Caucasian male with  h/o remote A. Fibrillation  In 2013 without recurrence and maintains sinus on Flecainide 50 mg BID chronically. (converted to sinus on Flecainide and metoprolol and discontinued excessive coffee drinking). He reports he is doing very well, staying very active and playing tennis mulitple times per week. He has not had recurrence of Afib since using flecainide on low dose. He denies any more episodes of palpitations and maintains a high energy level. He was last seen 2 months ago, blood pressure was uncontrolled, hence increased his dose of valsartan.  He now presents for hypertension follow-up.  He denies any chest pain, shortness of breath, PND, orthopnea, pedal edema, or syncope. Blood pressure has been 1675systolic and upper 80 diastolic at home.    Past Medical History:  Diagnosis Date  . 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 Atypical nevus 03/22/1998   moderate atypia - upper mid back (widershave)  . Atypical nevus 03/14/1999   slight atypia - left flank  . Atypical nevus 01/01/2006   moderate atypia - left outer scapula  . Atypical nevus 02/03/2007   slight atypia - left chest  . Atypical nevus 06/02/2008   atypical proliferation - left scapula - excision  . 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 05/30/2004   left upper arm - MOHs  . 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 10/13[place]   upper mid back - MOHs  . 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 06/05[place]2005   left scapula - MOHs  . Squamous cell carcinoma of skin 10/13/2003   right cheek - treated10/07/2003  . Squamous cell  carcinoma of skin 06/05/2005   left collarbone - MOHs  . Squamous cell carcinoma of skin 12/15/2008   top right ear - CX3 + 5FU  . 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 APPENDECTOMY     Social History   Socioeconomic History  . Marital status: Married    Spouse name: Not on file  . Number of children: 0  . Years of education: Not on file  . Highest education level: Not on file  Occupational History  . Not on file  Tobacco Use  . Smoking status: Former Smoker    Packs/day: 1.00    Years: 17.00    Pack years: 17.00    Types: Cigarettes    Quit date: 07/02/1967    Years since quitting: 51.7  . Smokeless tobacco: Former USystems developer   Quit date: 07/01/1969  Substance and Sexual Activity  . Alcohol use: Yes    Comment: occ  . Drug use: No  . Sexual activity: Not on file  Other Topics Concern  . Not on file  Social History Narrative  . Not on file   Social Determinants of Health   Financial Resource Strain:   . Difficulty of Paying Living Expenses: Not on  file  Food Insecurity:   . Worried About Charity fundraiser in the Last Year: Not on file  . Ran Out of Food in the Last Year: Not on file  Transportation Needs:   . Lack of Transportation (Medical): Not on file  . Lack of Transportation (Non-Medical): Not on file  Physical Activity:   . Days of Exercise per Week: Not on file  . Minutes of Exercise per Session: Not on file  Stress:   . Feeling of Stress : Not on file  Social Connections:   . Frequency of Communication with Friends and Family: Not on file  . Frequency of Social Gatherings with Friends and Family: Not on file  . Attends Religious Services: Not on file  . Active Member of Clubs or Organizations: Not on file  . Attends Archivist Meetings: Not on file  . Marital Status: Not on file    Intimate Partner Violence:   . Fear of Current or Ex-Partner: Not on file  . Emotionally Abused: Not on file  . Physically Abused: Not on file  . Sexually Abused: Not on file   ROS  Review of Systems  Constitution: Negative for chills, decreased appetite, malaise/fatigue and weight gain.  Cardiovascular: Negative for dyspnea on exertion, leg swelling and syncope.  Endocrine: Negative for cold intolerance.  Hematologic/Lymphatic: Does not bruise/bleed easily.  Musculoskeletal: Negative for joint swelling.  Gastrointestinal: Negative for abdominal pain, anorexia, change in bowel habit, hematochezia and melena.  Neurological: Negative for headaches and light-headedness.  Psychiatric/Behavioral: Negative for depression and substance abuse.  All other systems reviewed and are negative.  Objective  Blood pressure (!) 160/72, pulse 68, temperature (!) 96.2 F (35.7 C), temperature source Temporal, resp. rate 14, height 6' (1.829 m), weight 204 lb 11.2 oz (92.9 kg), SpO2 100 %.  Vitals with BMI 04/06/2019 04/06/2019 02/02/2019  Height - 6' 0"  6' 0"   Weight - 204 lbs 11 oz 200 lbs 3 oz  BMI - 16.10 96.04  Systolic 540 981 191  Diastolic 72 78 88  Pulse 68 72 62     Physical Exam  HENT:  Head: Atraumatic.  Eyes: Conjunctivae are normal.  Neck: No JVD present. No thyromegaly present.  Cardiovascular: Normal rate, regular rhythm, normal heart sounds and intact distal pulses. Exam reveals no gallop.  No murmur heard. No leg edema, no JVD.  Pulmonary/Chest: Effort normal and breath sounds normal.  Abdominal: Soft. Bowel sounds are normal.  Musculoskeletal:        General: Normal range of motion.     Cervical back: Neck supple.  Neurological: He is alert.  Skin: Skin is warm and dry.  Psychiatric: He has a normal mood and affect.   Laboratory examination:   Recent Labs    01/26/19 1119  NA 139  K 4.7  CL 103  CO2 24  GLUCOSE 95  BUN 14  CREATININE 0.91  CALCIUM 9.6   GFRNONAA 82  GFRAA 95   CrCl cannot be calculated (Patient's most recent lab result is older than the maximum 21 days allowed.).  CMP Latest Ref Rng & Units 01/26/2019 10/18/2007 10/17/2007  Glucose 65 - 99 mg/dL 95 106(H) 130(H)  BUN 8 - 27 mg/dL 14 16 16   Creatinine 0.76 - 1.27 mg/dL 0.91 0.81 0.96  Sodium 134 - 144 mmol/L 139 141 140  Potassium 3.5 - 5.2 mmol/L 4.7 3.7 3.5  Chloride 96 - 106 mmol/L 103 109 106  CO2 20 - 29 mmol/L 24  27 27  Calcium 8.6 - 10.2 mg/dL 9.6 8.8 9.0  Total Protein 6.0 - 8.5 g/dL 6.8 - -  Total Bilirubin 0.0 - 1.2 mg/dL 0.5 - -  Alkaline Phos 39 - 117 IU/L 46 - -  AST 0 - 40 IU/L 25 - -  ALT 0 - 44 IU/L 27 - -   CBC Latest Ref Rng & Units 01/26/2019 09/25/2007 09/25/2007  WBC 3.4 - 10.8 x10E3/uL 7.7 - 8.7  Hemoglobin 13.0 - 17.7 g/dL 14.2 12.9(L) 13.2  Hematocrit 37.5 - 51.0 % 43.2 38.0(L) 39.3  Platelets 150 - 450 x10E3/uL 292 - 225   Lipid Panel     Component Value Date/Time   CHOL 162 01/26/2019 1119   TRIG 95 01/26/2019 1119   HDL 71 01/26/2019 1119   LDLCALC 74 01/26/2019 1119   HEMOGLOBIN A1C No results found for: HGBA1C, MPG TSH No results for input(s): TSH in the last 8760 hours.  Labs 02/04/2018: Total cholesterol 187, triglycerides 95, HDL 57, LDL 111.  HB 14.6/HCT 42.6, platelets 280, normal indicis.  Serum glucose 101 mg, BUN 15, creatinine 0.99, eGFR greater than 60 mL, potassium 5.0.  CMP otherwise normal.   Medications and allergies  No Known Allergies   Current Outpatient Medications  Medication Instructions  . flecainide (TAMBOCOR) 50 MG tablet 2 times daily, 1 in the AM ; 1/2 PM  . simvastatin (ZOCOR) 40 MG tablet TAKE ONE TABLET BY MOUTH AT BEDTIME  . valsartan-hydrochlorothiazide (DIOVAN-HCT) 320-12.5 MG tablet 1 tablet, Oral, Daily    Radiology:  No results found.  Cardiac Studies:   Stress EKG 01/23/11: Normal stress EKG. ST dep back to baseline with exercise of 2.5 mm back to baseline at 1: 40 minutes into  recovery. 7 minutes and 10 METs.   Echocardiogram 01/08/2017: Left ventricle cavity is normal in size. Mild concentric hypertrophy of the left ventricle. Normal global wall motion. Calculated EF 61%. Left atrial cavity is mildly dilated. Mild (Grade I) mitral regurgitation. No significant change compared to prior study in 2012.  Assessment     ICD-10-CM   1. Essential hypertension  I10   2. History of atrial fibrillation without current medication 2013  Z86.79     EKG 02/02/2019: Sinus rhythm with first-degree AV block at rate of 61 bpm, leftward axis, poor R-wave progression, cannot exclude anteroseptal infarct old.  IVCD, atypical LBBB.  Consider LVH. No significant change from EKG 01/30/2018: Sinus rhythm with first-degree AV block at the rate of 65 bpm, left atrial enlargement, atypical left bundle branch block.  Recommendations:   Meds ordered this encounter  Medications  . valsartan-hydrochlorothiazide (DIOVAN-HCT) 320-12.5 MG tablet    Sig: Take 1 tablet by mouth daily.    Dispense:  30 tablet    Refill:  1    Order Specific Question:   Supervising Provider    Answer:   Adrian Prows [[id number]]    SEDDRICK FLAX  is a 74 y.o. Caucasian male with  h/o remote A. Fibrillation  In 2013 without recurrence and maintains sinus on Flecainide 50 mg BID chronically. (converted to sinus on Flecainide and metoprolol and discontinued excessive coffee drinking).   Patient is doing well since increasing his dose of valsartan; however, continues to be borderline elevated. Will further increase his Valsartan to 320-12.5 mg daily. He has normal kidney function and has been normal with lower dose of Valsartan. May consider obtaining labs at his next office visit. Encouraged him to start regular exercise.   He  does not want to be on anticoagulation as he has not had any recurrence of atrial fibrillation. No palpitations. Would have low threshold to start anticoagulation if he has recurrence of  palpitations. We will plan to see him back in 6 weeks for follow up on HTN. Continue with regular home monitoring.  Miquel Dunn, MSN, APRN, FNP-C [facility]. [place] Office: [phone] Fax: [phone]

## 2019-05-17 NOTE — Progress Notes (Signed)
Primary Physician/Referring:  Christain Sacramento, MD  Patient ID: [name], male    DOB: July 24, 1945, 74 y.o.   MRN: [id number]  Chief Complaint  Patient presents with  . Hypertension  . Follow-up    6 week   HPI:    HADYN Koch  is a 74 y.o. Caucasian male with  h/o remote A. Fibrillation  In 2013 without recurrence and maintains sinus on Flecainide 50 mg BID chronically. (converted to sinus on Flecainide and metoprolol and discontinued excessive coffee drinking).   He reports he is doing very well, staying very active and playing tennis mulitple times per week. He has not had recurrence of Afib since using flecainide on low dose. He now presents for hypertension follow-up. Blood pressure has been <678 systolic and < 80 diastolic at home. Tolerating all the medications.   He denies any chest pain, shortness of breath, PND, orthopnea, pedal edema, or syncope.   Past Medical History:  Diagnosis Date  . 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 Atypical nevus 03/22/1998   moderate atypia - upper mid back (widershave)  . Atypical nevus 03/14/1999   slight atypia - left flank  . Atypical nevus 01/01/2006   moderate atypia - left outer scapula  . Atypical nevus 02/03/2007   slight atypia - left chest  . Atypical nevus 06/02/2008   atypical proliferation - left scapula - excision  . 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 Squamous cell carcinoma of skin 10/13/2003   right cheek - treated10/07/2003  . Squamous cell carcinoma of skin 06/05/2005   left collarbone - MOHs  . Squamous cell carcinoma of skin 12/15/2008   top right ear - CX3 + 5FU  . Squamous cell  carcinoma, face 03/01/2015   right cheek - CX3 + 5FU  . 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 APPENDECTOMY     Social History   Tobacco Use  . Smoking status: Former Smoker    Packs/day: 1.00    Years: 17.00    Pack years: 17.00    Types: Cigarettes    Quit date: 07/02/1967    Years since quitting: 51.9  . Smokeless tobacco: Former Systems developer    Quit date: 07/01/1969  Substance Use Topics  . Alcohol use: Yes    Comment: occ    ROS  Review of Systems  Cardiovascular: Negative for chest pain, dyspnea on exertion and leg swelling.  Gastrointestinal: Negative for melena.   Objective  Blood pressure (!) 167/89, pulse 61, temperature 97.8 F (36.6 C), temperature source Temporal, resp. rate 16, height 6' (1.829 m), weight 201 lb (91.2 kg), SpO2 98 %.  Vitals with BMI 05/18/2019 05/18/2019 04/06/2019  Height - 6' 0"  -  Weight - 201 lbs -  BMI - 93.81 -  Systolic 017 510 258  Diastolic 89 68 82  Pulse 61 59 -     Physical Exam  Cardiovascular: Normal rate, regular rhythm, normal heart sounds and intact distal pulses. Exam reveals no gallop.  No murmur heard. No leg edema, no JVD.  Pulmonary/Chest: Effort normal and breath sounds normal.  Abdominal: Soft. Bowel sounds are normal.  Musculoskeletal:        General: Normal range of motion.   Laboratory examination:   Recent Labs    01/26/19 1119  NA 139  K 4.7  CL 103  CO2 24  GLUCOSE 95  BUN 14  CREATININE 0.91  CALCIUM 9.6  GFRNONAA 82  GFRAA 95   CrCl cannot be calculated (Patient's most recent lab result is older than the maximum 21 days allowed.).  CMP Latest Ref Rng & Units 01/26/2019 10/18/2007 10/17/2007  Glucose 65 - 99 mg/dL 95 106(H) 130(H)  BUN 8 - 27 mg/dL 14 16 16   Creatinine 0.76 - 1.27 mg/dL 0.91 0.81 0.96  Sodium 134 - 144 mmol/L 139 141 140  Potassium 3.5 - 5.2 mmol/L 4.7 3.7 3.5  Chloride 96 - 106 mmol/L 103 109 106    CO2 20 - 29 mmol/L 24 27 27   Calcium 8.6 - 10.2 mg/dL 9.6 8.8 9.0  Total Protein 6.0 - 8.5 g/dL 6.8 - -  Total Bilirubin 0.0 - 1.2 mg/dL 0.5 - -  Alkaline Phos 39 - 117 IU/L 46 - -  AST 0 - 40 IU/L 25 - -  ALT 0 - 44 IU/L 27 - -   CBC Latest Ref Rng & Units 01/26/2019 09/25/2007 09/25/2007  WBC 3.4 - 10.8 x10E3/uL 7.7 - 8.7  Hemoglobin 13.0 - 17.7 g/dL 14.2 12.9(L) 13.2  Hematocrit 37.5 - 51.0 % 43.2 38.0(L) 39.3  Platelets 150 - 450 x10E3/uL 292 - 225   Lipid Panel     Component Value Date/Time   CHOL 162 01/26/2019 1119   TRIG 95 01/26/2019 1119   HDL 71 01/26/2019 1119   LDLCALC 74 01/26/2019 1119   HEMOGLOBIN A1C No results found for: HGBA1C, MPG TSH No results for input(s): TSH in the last 8760 hours.   External labs:  Labs 02/04/2018: Total cholesterol 187, triglycerides 95, HDL 57, LDL 111.  HB 14.6/HCT 42.6, platelets 280, normal indicis.  Serum glucose 101 mg, BUN 15, creatinine 0.99, eGFR greater than 60 mL, potassium 5.0.  CMP otherwise normal.  Medications and allergies  No Known Allergies   Current Outpatient Medications  Medication Instructions  . flecainide (TAMBOCOR) 50 MG tablet TAKE ONE AND ONE-HALF TABLET BY MOUTH DAILY  . simvastatin (ZOCOR) 40 MG tablet TAKE ONE TABLET BY MOUTH AT BEDTIME  . valsartan-hydrochlorothiazide (DIOVAN-HCT) 320-12.5 MG tablet 1 tablet, Oral, Daily   Radiology:  No results found.  Cardiac Studies:   Stress EKG 01/23/11: Normal stress EKG. ST dep back to baseline with exercise of 2.5 mm back to baseline at 1: 40 minutes into recovery. 7 minutes and 10 METs.   Echocardiogram 01/08/2017: Left ventricle cavity is normal in size. Mild concentric hypertrophy of the left ventricle. Normal global wall motion. Calculated EF 61%. Left atrial cavity is mildly dilated. Mild (Grade I) mitral regurgitation. No significant change compared to prior study in 2012.  Assessment     ICD-10-CM   1. Essential hypertension  W43 Basic  metabolic panel    valsartan-hydrochlorothiazide (DIOVAN-HCT) 320-12.5 MG tablet    DISCONTINUED: valsartan-hydrochlorothiazide (DIOVAN-HCT) 320-12.5 MG tablet  2. Pure hypercholesterolemia  E78.00   3. History of atrial fibrillation without current medication 2013  Z86.79     EKG 02/02/2019: Sinus rhythm with first-degree AV block at rate of 61 bpm, leftward axis, poor R-wave progression, cannot exclude anteroseptal  infarct old.  IVCD, atypical LBBB.  Consider LVH. No significant change from EKG 01/30/2018: Sinus rhythm with first-degree AV block at the rate of 65 bpm, left atrial enlargement, atypical left bundle branch block.  Recommendations:   Meds ordered this encounter  Medications  . DISCONTD: valsartan-hydrochlorothiazide (DIOVAN-HCT) 320-12.5 MG tablet    Sig: Take 1 tablet by mouth daily.    Dispense:  90 tablet    Refill:  3  . valsartan-hydrochlorothiazide (DIOVAN-HCT) 320-12.5 MG tablet    Sig: Take 1 tablet by mouth daily.    Dispense:  30 tablet    Refill:  0    HOPE HOLST  is a 74 y.o. Caucasian male with  h/o remote A. Fibrillation  In 2013 without recurrence and maintains sinus on Flecainide 50 mg BID chronically. (converted to sinus on Flecainide and metoprolol and discontinued excessive coffee drinking.   Patient is doing well since increasing his dose of valsartan HCT; however, BP at home under excellent control and his BP apparatus matches with our equipment with regards to his BP.  Blood pressure has been <859 systolic and < 80 diastolic at home. Tolerating all the medications.  I will check BMP since making medication change.   He does not want to be on anticoagulation as he has not had any recurrence of atrial fibrillation. No palpitations. Would have low threshold to start anticoagulation if he has recurrence of palpitations. We will plan to see him back in 6 months. Continue with regular home monitoring. I sent prescription for both 30 days and also for 90  days for valsartan HCT as patient wants to synchronize all his medications.  Adrian Prows, MD, [facility] 05/18/2019, 11:25 AM [facility]. [place] Office: [phone]

## 2019-05-18 ENCOUNTER — Encounter: Payer: Self-pay | Admitting: Cardiology

## 2019-05-18 ENCOUNTER — Other Ambulatory Visit: Payer: Self-pay

## 2019-05-18 ENCOUNTER — Ambulatory Visit: Payer: Medicare PPO | Admitting: Cardiology

## 2019-05-18 VITALS — BP 167/89 | HR 61 | Temp 97.8°F | Resp 16 | Ht 72.0 in | Wt 201.0 lb

## 2019-05-18 DIAGNOSIS — I10 Essential (primary) hypertension: Secondary | ICD-10-CM

## 2019-05-18 DIAGNOSIS — Z8679 Personal history of other diseases of the circulatory system: Secondary | ICD-10-CM

## 2019-05-18 DIAGNOSIS — E7800 Pure hypercholesterolemia, unspecified: Secondary | ICD-10-CM

## 2019-05-18 MED ORDER — VALSARTAN-HYDROCHLOROTHIAZIDE 320-12.5 MG PO TABS: 1.0000 | ORAL_TABLET | Freq: Every day | ORAL | 3 refills | Status: DC

## 2019-05-18 MED ORDER — VALSARTAN-HYDROCHLOROTHIAZIDE 320-12.5 MG PO TABS: 1.0000 | ORAL_TABLET | Freq: Every day | ORAL | 0 refills | Status: DC

## 2019-05-26 LAB — BASIC METABOLIC PANEL
BUN/Creatinine Ratio: 14 (ref 10–24)
BUN: 14 mg/dL (ref 8–27)
CO2: 22 mmol/L (ref 20–29)
Calcium: 9.4 mg/dL (ref 8.6–10.2)
Chloride: 101 mmol/L (ref 96–106)
Creatinine, Ser: 0.99 mg/dL (ref 0.76–1.27)
GFR calc Af Amer: 85 mL/min/{1.73_m2} (ref >59)
GFR calc non Af Amer: 74 mL/min/{1.73_m2} (ref >59)
Glucose: 93 mg/dL (ref 65–99)
Potassium: 4.5 mmol/L (ref 3.5–5.2)
Sodium: 138 mmol/L (ref 134–144)

## 2019-10-16 ENCOUNTER — Other Ambulatory Visit: Payer: Self-pay

## 2019-10-16 MED ORDER — SIMVASTATIN 40 MG PO TABS: 40.0000 mg | ORAL_TABLET | Freq: Every day | ORAL | 4 refills | Status: DC

## 2019-11-18 ENCOUNTER — Ambulatory Visit: Payer: Medicare PPO | Admitting: Cardiology

## 2019-11-18 ENCOUNTER — Other Ambulatory Visit: Payer: Self-pay

## 2019-11-18 ENCOUNTER — Encounter: Payer: Self-pay | Admitting: Cardiology

## 2019-11-18 VITALS — BP 140/76 | HR 61 | Resp 15 | Ht 72.0 in | Wt 200.0 lb

## 2019-11-18 DIAGNOSIS — Z125 Encounter for screening for malignant neoplasm of prostate: Secondary | ICD-10-CM

## 2019-11-18 DIAGNOSIS — I10 Essential (primary) hypertension: Secondary | ICD-10-CM

## 2019-11-18 DIAGNOSIS — Z8679 Personal history of other diseases of the circulatory system: Secondary | ICD-10-CM

## 2019-11-18 DIAGNOSIS — E7800 Pure hypercholesterolemia, unspecified: Secondary | ICD-10-CM

## 2019-11-18 MED ORDER — VALSARTAN 320 MG PO TABS: 320.0000 mg | ORAL_TABLET | Freq: Every day | ORAL | 1 refills | Status: DC

## 2019-11-18 NOTE — Progress Notes (Signed)
Primary Physician/Referring:  Christain Sacramento, MD  Patient ID: [name], male    DOB: 01/06/1946, 74 y.o.   MRN: [id number]  Chief Complaint  Patient presents with   Follow-up    6 month   Atrial Fibrillation   Hypertension   HPI:    [name]  is a 74 y.o. Caucasian male with  h/o remote A. Fibrillation  In 2013 without recurrence and maintains sinus on Flecainide 50 mg BID chronically. (converted to sinus on Flecainide and metoprolol and discontinued excessive coffee drinking).   He reports he is doing very well, staying very active and playing tennis mulitple times per week. He has not had recurrence of Afib since using flecainide on low dose. He now presents for hypertension and hyperlipidemia follow-up.   He denies any chest pain, shortness of breath, PND, orthopnea, pedal edema, or syncope.   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   left sideburn - [facility] 02/27/2018   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, 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   left upper arm - MOHs   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   upper mid back - MOHs   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[place]) 06/05/2005   left scapula - MOHs   Squamous cell carcinoma of skin 10/13/2003   right cheek - treated10/07/2003   Squamous cell carcinoma of skin 06/05/2005   left collarbone - MOHs   Squamous cell carcinoma of skin 12/15/2008   top right ear - CX3 + 5FU   Squamous cell carcinoma, face 03/01/2015   right cheek - CX3 + 5FU     Squamous cell carcinoma, face 03/01/2015   right side of nose - MOHs   Past Surgical History:  Procedure Laterality Date   APPENDECTOMY     Social History   Tobacco Use   Smoking status: Former Smoker    Packs/day: 1.00    Years: 17.00    Pack years: 17.00    Types: Cigarettes    Quit date: 07/02/1967    Years since quitting: 52.4   Smokeless tobacco: Former Systems developer    Quit date: 07/01/1969  Substance Use Topics   Alcohol use: Yes    Alcohol/week: 2.0 standard drinks    Types: 2 Cans of beer per week    Comment: nightly  Married    ROS  Review of Systems  Cardiovascular: Negative for chest pain, dyspnea on exertion and leg swelling.  Gastrointestinal: Negative for melena.   Objective  Blood pressure 140/76, pulse 61, resp. rate 15, height 6' (1.829 m), weight 200 lb (90.7 kg), SpO2 99 %.  Vitals with BMI 11/18/2019 11/18/2019 05/18/2019  Height - 6' 0"  -  Weight - 200 lbs -  BMI - 29.52 -  Systolic 841 324 401  Diastolic 76 76 89  Pulse - 61 61     Physical Exam Cardiovascular:     Rate and Rhythm: Normal rate and regular rhythm.  Pulses: Intact distal pulses.     Heart sounds: Normal heart sounds. No murmur heard.  No gallop.      Comments: No leg edema, no JVD. Pulmonary:     Effort: Pulmonary effort is normal.     Breath sounds: Normal breath sounds.  Abdominal:     General: Bowel sounds are normal.     Palpations: Abdomen is soft.  Musculoskeletal:        General: Normal range of motion.    Laboratory examination:   Recent Labs    01/26/19 1119 05/25/19 0941  NA 139 138  K 4.7 4.5  CL 103 101  CO2 24 22  GLUCOSE 95 93  BUN 14 14  CREATININE 0.91 0.99  CALCIUM 9.6 9.4  GFRNONAA 82 74  GFRAA 95 85   CrCl cannot be calculated (Patient's most recent lab result is older than the maximum 21 days allowed.).  CMP Latest Ref Rng & Units 05/25/2019 01/26/2019 10/18/2007  Glucose 65 - 99 mg/dL 93 95 106(H)  BUN 8 - 27 mg/dL 14 14 16   Creatinine  0.76 - 1.27 mg/dL 0.99 0.91 0.81  Sodium 134 - 144 mmol/L 138 139 141  Potassium 3.5 - 5.2 mmol/L 4.5 4.7 3.7  Chloride 96 - 106 mmol/L 101 103 109  CO2 20 - 29 mmol/L 22 24 27   Calcium 8.6 - 10.2 mg/dL 9.4 9.6 8.8  Total Protein 6.0 - 8.5 g/dL - 6.8 -  Total Bilirubin 0.0 - 1.2 mg/dL - 0.5 -  Alkaline Phos 39 - 117 IU/L - 46 -  AST 0 - 40 IU/L - 25 -  ALT 0 - 44 IU/L - 27 -   CBC Latest Ref Rng & Units 01/26/2019 09/25/2007 09/25/2007  WBC 3.4 - 10.8 x10E3/uL 7.7 - 8.7  Hemoglobin 13.0 - 17.7 g/dL 14.2 12.9(L) 13.2  Hematocrit 37.5 - 51.0 % 43.2 38.0(L) 39.3  Platelets 150 - 450 x10E3/uL 292 - 225   Lipid Panel Recent Labs    01/26/19 1119  CHOL 162  TRIG 95  LDLCALC 74  HDL 71    HEMOGLOBIN A1C No results found for: HGBA1C, MPG TSH No results for input(s): TSH in the last 8760 hours.   External labs:  Labs 02/04/2018: Total cholesterol 187, triglycerides 95, HDL 57, LDL 111.  HB 14.6/HCT 42.6, platelets 280, normal indicis.  Serum glucose 101 mg, BUN 15, creatinine 0.99, eGFR greater than 60 mL, potassium 5.0.  CMP otherwise normal.  Medications and allergies  No Known Allergies   Current Outpatient Medications on File Prior to Visit  Medication Sig Dispense Refill   aspirin EC 81 MG tablet Take 81 mg by mouth daily. Pt reports taking 1/2 tablet daily.     flecainide (TAMBOCOR) 50 MG tablet TAKE ONE AND ONE-HALF TABLET BY MOUTH DAILY 135 tablet 3   omeprazole (PRILOSEC) 20 MG capsule Take 20 mg by mouth daily.     simvastatin (ZOCOR) 40 MG tablet Take 1 tablet (40 mg total) by mouth at bedtime. 90 tablet 4   No current facility-administered medications on file prior to visit.    Radiology:  No results found.  Cardiac Studies:   Stress EKG 01/23/11: Normal stress EKG. ST dep back to baseline with exercise of 2.5 mm back to baseline at 1: 40 minutes into recovery. 7 minutes and 10 METs.   Echocardiogram 01/08/2017: Left ventricle cavity is normal in size.  Mild concentric hypertrophy of the left ventricle. Normal global wall motion. Calculated EF  61%. Left atrial cavity is mildly dilated. Mild (Grade I) mitral regurgitation. No significant change compared to prior study in 2012.  EKG:   EKG 11/18/2019: Sinus rhythm with first-degree AV block at rate of 61 bpm, left axis deviation, left intrafascicular block.  Poor R wave progression, cannot exclude anteroseptal infarct old.  IVCD, incomplete left bundle branch block.  Borderline criteria for LVH.  No significant change from 02/02/2019.  Assessment     ICD-10-CM   1. Essential hypertension  I10 EKG 12-Lead    CMP14+EGFR    PSA    valsartan (DIOVAN) 320 MG tablet  2. History of atrial fibrillation without current medication 2013  Z86.79   3. Pure hypercholesterolemia  E78.00 Lipid Panel With LDL/HDL Ratio  4. Screening for prostate cancer  Z12.5 PSA    Recommendations:   Meds ordered this encounter  Medications   valsartan (DIOVAN) 320 MG tablet    Sig: Take 1 tablet (320 mg total) by mouth daily.    Dispense:  90 tablet    Refill:  1    Patient will finish Valsartan HCT and will pick up Valsartan plain next month    Medications Discontinued During This Encounter  Medication Reason   olmesartan (BENICAR) 20 MG tablet Error   valsartan-hydrochlorothiazide (DIOVAN-HCT) 320-12.5 MG tablet Change in therapy    [name]  is a 74 y.o. Caucasian male with  h/o remote A. Fibrillation  In 2013 without recurrence and maintains sinus on Flecainide 50 mg BID chronically. (converted to sinus on Flecainide and metoprolol and discontinued excessive coffee drinking.  On his last office visit I will increase the dose of valsartan to 320/HCT 12.5 mg daily which she is only taking 1/2 tablet as it caused him to have marked fatigue and dizziness.  His blood pressure is elevated today, I will discontinue valsartan HCT once his prescription runs out and will switch him to plain valsartan 320 mg  daily.  With regard to EKG, PAF, he maintained sinus rhythm.  Does not want to be on anticoagulation as he has not had any recurrence.  Lipids are well controlled, he does need lipid profile testing.  He requested I perform a PSA as well.  He has not seen his PCP in a while, I encouraged him to set up an appointment for complete physical as I am just taking care of his cardiac issues.  Patient agrees and he will make an appointment.  I will see him back in 6 months.   Philip Prows, MD, [facility] 11/18/2019, 10:40 AM Office: [phone]

## 2019-11-18 NOTE — Patient Instructions (Signed)
I have recommended that you take valsartan HCT 320/12.5 mg 1/2 tablet like you have been taking every other day and increase to a whole tablet every other day.  After you are done with your prescription, please send me a message so I can send plain valsartan 320 mg daily.

## 2019-11-25 ENCOUNTER — Other Ambulatory Visit: Payer: Self-pay

## 2019-11-25 ENCOUNTER — Emergency Department (HOSPITAL_COMMUNITY): Payer: Medicare PPO

## 2019-11-25 ENCOUNTER — Emergency Department (HOSPITAL_COMMUNITY)
Admission: EM | Admit: 2019-11-25 | Discharge: 2019-11-26 | Disposition: A | Discharge status: Home / Self Care | Payer: Medicare PPO | Attending: Emergency Medicine | Admitting: Emergency Medicine

## 2019-11-25 DIAGNOSIS — Z79899 Other long term (current) drug therapy: Secondary | ICD-10-CM | POA: Insufficient documentation

## 2019-11-25 DIAGNOSIS — R001 Bradycardia, unspecified: Secondary | ICD-10-CM | POA: Diagnosis not present

## 2019-11-25 DIAGNOSIS — Z87891 Personal history of nicotine dependence: Secondary | ICD-10-CM | POA: Insufficient documentation

## 2019-11-25 DIAGNOSIS — E860 Dehydration: Secondary | ICD-10-CM | POA: Diagnosis not present

## 2019-11-25 DIAGNOSIS — R55 Syncope and collapse: Secondary | ICD-10-CM | POA: Diagnosis present

## 2019-11-25 DIAGNOSIS — I10 Essential (primary) hypertension: Secondary | ICD-10-CM | POA: Insufficient documentation

## 2019-11-25 DIAGNOSIS — N179 Acute kidney failure, unspecified: Secondary | ICD-10-CM | POA: Diagnosis not present

## 2019-11-25 DIAGNOSIS — Z7982 Long term (current) use of aspirin: Secondary | ICD-10-CM | POA: Diagnosis not present

## 2019-11-25 NOTE — ED Triage Notes (Signed)
Pt arrives via ems from home. Pt states he was working outside all day with no fluids. Pt states he was at dinner when he passed out he states he felt lightheaded and dizzy before. Ems states they tried to stand pt up when he had another syncopal episode where his HR dropped to 20s and pt became incontinent.

## 2019-11-25 NOTE — ED Provider Notes (Signed)
[facility] EMERGENCY DEPARTMENT Provider Note   CSN: [id number] Arrival date & time:        History Chief Complaint  Patient presents with  . Loss of Consciousness    [name] is a 74 y.o. male with history of HTN, HLD, paroxysmal atrial fibrillation on flecainide, pituitary adenoma s/p trans-sphenoid re-section, malignant melanoma in situ, squamous cell carcinoma who presents the emergency department by EMS from home with a chief complaint of syncope.  The patient reports that he worked out in the yard for more than 4 hours this afternoon mowing the lawn with a push mower and then later blowing leaves off of his driveway.  He reports that he has had poor p.o. intake for most of the day and is feeling very thirsty.  He notes that he had one beer and 1 liquor drink after completing his yard work and coming into the house.  After eating dinner, he began feeling poorly and "hot all over".  Reports that he had walked over to the couch and began feeling very dizzy and lightheaded while he was ambulated that resolved after sitting.  He then attempted to ambulate back into the kitchen and dizziness and lightheadedness returned.  He stood up from his chair and felt very dizzy and lightheaded, as if he might pass out.  States that he could feel himself going down to the ground and his wife was able to catch him.  He hit his right elbow on the countertop, but does not believe that he hit his head.   His wife reports that he did not respond for several seconds, and as he came to he cannot recall the details of what it happened.  Otherwise, he was not confused.  Reports that he had urinary incontinence with the episode, and the patient was very diaphoretic.  No shaking or jerking.  The patient had another syncopal episode after attempting to stand when EMS arrived.  EMS noted the patient's heart rate was in the 20s.  In the ER, his only complaint is that he is feeling very  thirsty.  He notes decreased urine output today.  He denies chest pain, shortness of breath, fever, chills, nausea, vomiting, diarrhea, fecal incontinence, tongue biting, confusion, headache, visual changes, numbness, weakness, neck pain, back pain.  He reports that he had a single seizure several years ago, but no recurrent episodes.  He did have a pituitary adenoma removed by Dr. Sherwood Gambler in 2009.  He reports a history of a previous syncopal episode that presented similarly where he knew he was going to pass out and was very diaphoretic prior to the episode.  He has been compliant with his home flecainide.  The history is provided by the patient and medical records. No language interpreter was used.       Past Medical History:  Diagnosis Date  . 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 Atypical nevus 03/22/1998   moderate atypia - upper mid back (widershave)  . Atypical nevus 03/14/1999   slight atypia - left flank  . Atypical nevus 01/01/2006   moderate atypia - left outer scapula  . Atypical nevus 02/03/2007   slight atypia - left chest  . Atypical nevus 06/02/2008   atypical proliferation - left scapula - excision  . 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[place]) 10/13/2003   upper mid back - MOHs  . 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[place]) 06/05/2005   left scapula - MOHs  . Squamous cell carcinoma of skin 10/13/2003   right cheek - treated10/07/2003  . Squamous cell carcinoma of skin 06/05/2005   left collarbone - MOHs  . Squamous cell carcinoma of skin 12/15/2008   top right ear - CX3 + 5FU  . 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 Squamous cell carcinoma, face 03/01/2015   right side of nose - MOHs    Patient Active Problem List    Diagnosis Date Noted  . Hyperlipemia 08/25/2015  . Hypertension 08/25/2015  . Rectus diastasis 07/01/2013    Past Surgical History:  Procedure Laterality Date  . APPENDECTOMY         Family History  Problem Relation Age of Onset  . Heart attack Father     Social History   Tobacco Use  . Smoking status: Former Smoker    Packs/day: 1.00    Years: 17.00    Pack years: 17.00    Types: Cigarettes    Quit date: 07/02/1967    Years since quitting: 52.4  . Smokeless tobacco: Former Systems developer    Quit date: 07/01/1969  Vaping Use  . Vaping Use: Never used  Substance Use Topics  . Alcohol use: Yes    Alcohol/week: 2.0 standard drinks    Types: 2 Cans of beer per week    Comment: nightly  . Drug use: No    Home Medications Prior to Admission medications   Medication Sig Start Date End Date Taking? Authorizing Provider  aspirin EC 81 MG tablet Take 81 mg by mouth daily. Pt reports taking 1/2 tablet daily.    [provider]  flecainide (TAMBOCOR) 50 MG tablet TAKE ONE AND ONE-HALF TABLET BY MOUTH DAILY 04/07/19   Miquel Dunn, NP  omeprazole (PRILOSEC) 20 MG capsule Take 20 mg by mouth daily.    [provider]  simvastatin (ZOCOR) 40 MG tablet Take 1 tablet (40 mg total) by mouth at bedtime. 10/16/19   Adrian Prows, MD  valsartan (DIOVAN) 320 MG tablet Take 1 tablet (320 mg total) by mouth daily. 12/19/19   Adrian Prows, MD    Allergies    Patient has no known allergies.  Review of Systems   Review of Systems  Constitutional: Positive for diaphoresis. Negative for appetite change and fever.  HENT: Negative for congestion, sinus pressure, sinus pain and sore throat.   Eyes: Negative for photophobia and visual disturbance.  Respiratory: Negative for shortness of breath.   Cardiovascular: Negative for chest pain.  Gastrointestinal: Negative for abdominal pain, constipation, diarrhea, nausea and vomiting.  Endocrine: Negative for polyuria.  Genitourinary:  Negative for dysuria and urgency.       Urinary incontinence  Musculoskeletal: Negative for arthralgias, back pain, myalgias, neck pain and neck stiffness.  Skin: Negative for rash.  Allergic/Immunologic: Negative for immunocompromised state.  Neurological: Positive for syncope. Negative for dizziness, seizures, speech difficulty, weakness, light-headedness, numbness and headaches.  Psychiatric/Behavioral: Negative for confusion.    Physical Exam Updated Vital Signs BP (!) 151/73   Pulse (!) 49   Temp 98.4 F (36.9 C) (Oral)   Resp 17   Ht 6' (1.829 m)   Wt 88.9 kg   SpO2 98%   BMI 26.58 kg/m   Physical  Exam Vitals and nursing note reviewed.  Constitutional:      General: He is not in acute distress.    Appearance: He is well-developed and normal weight. He is not ill-appearing, toxic-appearing or diaphoretic.  HENT:     Head: Normocephalic.     Mouth/Throat:     Mouth: Mucous membranes are dry.     Pharynx: No oropharyngeal exudate or posterior oropharyngeal erythema.  Eyes:     Extraocular Movements: Extraocular movements intact.     Conjunctiva/sclera: Conjunctivae normal.     Pupils: Pupils are equal, round, and reactive to light.  Cardiovascular:     Rate and Rhythm: Normal rate and regular rhythm.     Pulses: Normal pulses.     Heart sounds: Normal heart sounds. No murmur heard.  No friction rub. No gallop.      Comments: Peripheral pulses are 2+ and symmetric. Pulmonary:     Effort: Pulmonary effort is normal. No respiratory distress.     Breath sounds: No stridor. No wheezing, rhonchi or rales.  Chest:     Chest wall: No tenderness.  Abdominal:     General: There is no distension.     Palpations: Abdomen is soft. There is no mass.     Tenderness: There is no abdominal tenderness. There is no right CVA tenderness, left CVA tenderness, guarding or rebound.     Hernia: No hernia is present.  Musculoskeletal:     Cervical back: Neck supple.  Skin:     General: Skin is warm and dry.     Capillary Refill: Capillary refill takes more than 3 seconds.     Comments: Superficial abrasion noted to the right elbow.  Full active and passive range of motion of the right elbow without tenderness.  Normal exam of the right shoulder and wrist.  Neurological:     Mental Status: He is alert.     Comments: Cranial nerves II through XII are grossly intact.  5 of 5 strength against resistance of the bilateral upper and lower extremities.  Sensation is intact and equal throughout.  No pronator drift.  Finger-to-nose is intact bilaterally.  GCS 15.  Alert and oriented x3.  Psychiatric:        Behavior: Behavior normal.     ED Results / Procedures / Treatments   Labs (all labs ordered are listed, but only abnormal results are displayed) Labs Reviewed  CBC WITH DIFFERENTIAL/PLATELET - Abnormal; Notable for the following components:      Result Value   WBC 10.7 (*)    Hemoglobin 12.9 (*)    Neutro Abs 8.3 (*)    All other components within normal limits  COMPREHENSIVE METABOLIC PANEL - Abnormal; Notable for the following components:   Glucose, Bld 130 (*)    Creatinine, Ser 1.41 (*)    Alkaline Phosphatase 37 (*)    GFR, Estimated 48 (*)    All other components within normal limits  URINALYSIS, ROUTINE W REFLEX MICROSCOPIC  CBG MONITORING, ED  TROPONIN I (HIGH SENSITIVITY)  TROPONIN I (HIGH SENSITIVITY)    EKG EKG Interpretation  Date/Time:  Wednesday November 25 2019 21:57:54 EDT Ventricular Rate:  58 PR Interval:    QRS Duration: 153 QT Interval:  442 QTC Calculation: 435 R Axis:   -20 Text Interpretation: Sinus rhythm with 1st degree A-V block Left bundle branch block since last tracing no significant change (01/2019) Confirmed by Addison Lank [phone]) on 11/26/2019 12:42:06 AM   Radiology CT HEAD WO CONTRAST  Result Date: 11/25/2019 CLINICAL DATA:  Head trauma EXAM: CT HEAD WITHOUT CONTRAST TECHNIQUE: Contiguous axial images were  obtained from the base of the skull through the vertex without intravenous contrast. COMPARISON:  Brain MRI 08/23/2010 FINDINGS: Brain: There is no mass, hemorrhage or extra-axial collection. The size and configuration of the ventricles and extra-axial CSF spaces are normal. The brain parenchyma is normal, without acute or chronic infarction. 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 Vascular: No abnormal hyperdensity of the major intracranial arteries or dural venous sinuses. No intracranial atherosclerosis. Skull: The visualized skull base, calvarium and extracranial soft tissues are normal. Sinuses/Orbits: Left sphenoid sinus opacification with chronic osseous remodeling. The orbits are normal. IMPRESSION: 1. No acute intracranial abnormality. 2. 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 Electronically Signed   By: Ulyses Jarred M.D.   On: 11/25/2019 23:11    Procedures Procedures (including critical care time)  Medications Ordered in ED Medications  sodium chloride 0.9 % bolus 1,000 mL (0 mLs Intravenous Stopped 11/26/19 0306)    ED Course  I have reviewed the triage vital signs and the nursing notes.  Pertinent labs & imaging results that were available during my care of the patient were reviewed by me and considered in my medical decision making (see chart for details).  Clinical Course as of Nov 26 822  Thu Nov 26, 2019  0240 Patient recheck.  Updated family on repeat cardiac enzyme.  Heart rate is now notably in the mid to low 40s on the cardiac monitor.  Patient's heart rate has previously been in the 60s all previous evaluations at bedside.  Will repeat EKG.   [MM]  [phone] Spoke with Dr. Virgina Jock, cardiology, regarding patient's syncopal episode as patient is now bradycardic in the low 40s.  Patient has had no episodes of tachycardia since arriving in the ER.  He recommended ambulating the patient in the ER.  If he  developed symptomatic bradycardia, cardiology will admit the patient.  If he remains asymptomatic, he can follow-up with Dr. Einar Gip in the office, and he will put in for a Holter monitor for the patient.   [MM]    Clinical Course User Index [MM] Shyane Fossum, Laymond Purser, PA-C   MDM Rules/Calculators/A&P                          74 year old male with history of HTN, HLD, paroxysmal atrial fibrillation on flecainide, pituitary adenoma s/p surgical removal malignant melanoma in situ, squamous cell carcinoma presenting by EMS after 2 post-prandial syncopal episodes earlier tonight.  Episodes were preceded by prodromal symptoms.  Patient also noted poor p.o. intake for the day after performing yard work for several hours.   Per triage note, EMS noted patient's heart rate was in the 20s when they arrived and initially evaluated the patient.  Multiple EMS rhythm strips are at bedside, none of which demonstrate bradycardia.  Patient's heart rate is persistently been in the 60s since arrival in the ER.  He is minimally hypertensive and vital signs are otherwise unremarkable.  EKG unchanged from previous with first-degree heart block, left bundle branch block, and sinus rhythm. Patient's hemoglobin is stable from previous.  He is a borderline leukocytosis of 10.7.  Creatinine is 1.41, up from 1.0 earlier this year.  Orthostatic vital signs are negative.  Head CT with unchanged appearance of low-attenuation mass within the right sella turcica, but no acute intracranial abnormalities.  The patient was seen and independently evaluated by Dr. Leonette Monarch, attending physician.  Given concern for bradycardia, delta troponin was obtained, which was not elevated and had a flat trend.  Patient had been treated with 1 L of fluids in the ER.  Patient also reports that he was given IV fluids in route by EMS, but this was not documented for prearrival intake.  He has no history of heart failure.  He has remained asymptomatic since he  has arrived in the ER and has been observed for more than 6 hours.  When I went to update the patient on his second troponin, he was noted to be bradycardic in the low 40s on the cardiac monitor.  Patient's wife noted that heart rate had been low for the last 30 minutes to an hour.  He was ambulated by me and RN in the ER and was asymptomatic.  Discussed patient's presentation, work-up, and asymptomatic bradycardia with on-call cardiologist, Dr. Virgina Jock, who recommends follow-up for Holter monitor with cardiology.  Also discussed with the patient's wife that if they had any concerns regarding patient's bradycardia that he could be admitted for observation, but the patient and his wife declined.  They would prefer to follow closely with cardiology.  I think this is reasonable given that patient is asymptomatic at this time.  Strongly suspect vasovagal syncope given prodromal symptoms earlier today that may have been exacerbated by dehydration, given new AKI, in the postprandial setting.  ER return precautions given.  The patient is hemodynamically stable to no acute distress.  Safe for discharge to home with outpatient follow-up with cardiology as indicated.  Final Clinical Impression(s) / ED Diagnoses Final diagnoses:  Vasovagal syncope  Bradycardia  Dehydration after exertion  AKI (acute kidney injury) [facility])    Rx / DC Orders ED Discharge Orders    None       Joanne Gavel, PA-C 11/26/19 0824    Fatima Blank, MD 11/29/19 2103

## 2019-11-26 ENCOUNTER — Telehealth: Payer: Self-pay | Admitting: Cardiology

## 2019-11-26 DIAGNOSIS — R55 Syncope and collapse: Secondary | ICD-10-CM

## 2019-11-26 LAB — CBC WITH DIFFERENTIAL/PLATELET
Abs Immature Granulocytes: 0.06 10*3/uL (ref 0.00–0.07)
Basophils Absolute: 0.1 10*3/uL (ref 0.0–0.1)
Basophils Relative: 1 %
Eosinophils Absolute: 0.1 10*3/uL (ref 0.0–0.5)
Eosinophils Relative: 1 %
HCT: 39.4 % (ref 39.0–52.0)
Hemoglobin: 12.9 g/dL — ABNORMAL LOW (ref 13.0–17.0)
Immature Granulocytes: 1 %
Lymphocytes Relative: 15 %
Lymphs Abs: 1.6 10*3/uL (ref 0.7–4.0)
MCH: 29.5 pg (ref 26.0–34.0)
MCHC: 32.7 g/dL (ref 30.0–36.0)
MCV: 90.2 fL (ref 80.0–100.0)
Monocytes Absolute: 0.6 10*3/uL (ref 0.1–1.0)
Monocytes Relative: 6 %
Neutro Abs: 8.3 10*3/uL — ABNORMAL HIGH (ref 1.7–7.7)
Neutrophils Relative %: 76 %
Platelets: 250 10*3/uL (ref 150–400)
RBC: 4.37 MIL/uL (ref 4.22–5.81)
RDW: 13.1 % (ref 11.5–15.5)
WBC: 10.7 10*3/uL — ABNORMAL HIGH (ref 4.0–10.5)
nRBC: 0 % (ref 0.0–0.2)

## 2019-11-26 LAB — COMPREHENSIVE METABOLIC PANEL
ALT: 23 U/L (ref 0–44)
AST: 21 U/L (ref 15–41)
Albumin: 4 g/dL (ref 3.5–5.0)
Alkaline Phosphatase: 37 U/L — ABNORMAL LOW (ref 38–126)
Anion gap: 7 (ref 5–15)
BUN: 23 mg/dL (ref 8–23)
CO2: 24 mmol/L (ref 22–32)
Calcium: 9.1 mg/dL (ref 8.9–10.3)
Chloride: 105 mmol/L (ref 98–111)
Creatinine, Ser: 1.41 mg/dL — ABNORMAL HIGH (ref 0.61–1.24)
GFR, Estimated: 48 mL/min — ABNORMAL LOW (ref >60)
Glucose, Bld: 130 mg/dL — ABNORMAL HIGH (ref 70–99)
Potassium: 4.2 mmol/L (ref 3.5–5.1)
Sodium: 136 mmol/L (ref 135–145)
Total Bilirubin: 0.8 mg/dL (ref 0.3–1.2)
Total Protein: 6.8 g/dL (ref 6.5–8.1)

## 2019-11-26 LAB — URINALYSIS, ROUTINE W REFLEX MICROSCOPIC
Bilirubin Urine: NEGATIVE
Glucose, UA: NEGATIVE mg/dL
Hgb urine dipstick: NEGATIVE
Ketones, ur: NEGATIVE mg/dL
Leukocytes,Ua: NEGATIVE
Nitrite: NEGATIVE
Protein, ur: NEGATIVE mg/dL
Specific Gravity, Urine: 1.012 (ref 1.005–1.030)
pH: 6 (ref 5.0–8.0)

## 2019-11-26 LAB — TROPONIN I (HIGH SENSITIVITY)
Troponin I (High Sensitivity): 5 ng/L (ref <18)
Troponin I (High Sensitivity): 6 ng/L (ref <18)

## 2019-11-26 MED ORDER — SODIUM CHLORIDE 0.9 % IV BOLUS
1000.0000 mL | Freq: Once | INTRAVENOUS | Status: AC
  Administered 2019-11-26: 1000 mL via INTRAVENOUS

## 2019-11-26 NOTE — Telephone Encounter (Signed)
Monitor appt scheduled for 11/27/2019. F/u w/Celeste on 12/18/2019

## 2019-11-26 NOTE — Telephone Encounter (Signed)
LMOM to schedule appts for monitor & f/u

## 2019-11-26 NOTE — ED Notes (Signed)
Pt able to ambulate in the hallway without difficulty. Pt reported no dizziness or lightheaded

## 2019-11-26 NOTE — Discharge Instructions (Addendum)
Thank you for allowing me to care for you today in the Emergency Department.   You were seen in the ER today after you passed out.  This was thought to be due to a vasovagal episode that was exacerbated by dehydration after working out in the yard today.  It is very important that if you are working out in the heat that you stay hydrated.  Try to drink at least 64 ounces of water daily.  Drinking coffee and alcohol can actually make you more dehydrated as both of these substances are diuretics, which cause increased urination.  There was concern that your heart rate was very low when EMS arrived.  Your EKG did not show any changes in your heart enzymes were not elevated.  We discussed your heart rate with the on-call cardiologist, and a heart monitor was recommended.  Dr. Irven Shelling office should be calling to get the heart monitor scheduled, but I would also recommend calling their office to help coordinate your care.  I would like for you to be seen by Dr. Einar Gip within the next week for recheck of your symptoms.  Return to the emergency department if you have another episode where you pass out, if you develop difficulty breathing, feeling as if your heart is beating out of your chest, chest pain, if you stop producing urine, or if you develop other new, concerning symptoms.

## 2019-11-26 NOTE — Telephone Encounter (Signed)
Patient with paroxysmal Afib, on flecainide. He was in the ED overnight with two episodes of brief syncope, preceeded by prodromal symptoms. He was found to be briefly bradycardic in 20s by EMS. HR stayed >40 bpm in sinus rhythm while in the ED. No ventricular arrhythmia notes. He was hydrated in the ED. He ambulated without any symptoms. Cr mildly elevated s/o AKI, likely from dehydration. Patient is being discharged from the ED.  Recommend two week live monitor for evaluation of syncope. Order placed. F/u w/Dr. Francis Gaines, PA after that  Staff, please arrange.    Nigel Mormon, MD Pager: [phone] Office: [phone]

## 2019-11-27 ENCOUNTER — Other Ambulatory Visit: Payer: Self-pay

## 2019-11-27 ENCOUNTER — Ambulatory Visit: Payer: Medicare PPO

## 2019-11-27 DIAGNOSIS — R55 Syncope and collapse: Secondary | ICD-10-CM

## 2019-11-29 NOTE — ED Provider Notes (Signed)
Attestation: Medical screening examination/treatment/procedure(s) were conducted as a shared visit with non-physician practitioner(s) and myself.  I personally evaluated the patient during the encounter.   Briefly, the patient is a 74 y.o. male with h/o HTN, HLD, paroxysmal atrial fibrillation on flecainide, pituitary adenoma s/p trans-sphenoid re-section, malignant melanoma in situ, squamous cell carcinoma who presents the emergency department by EMS from home with a chief complaint of syncope  Vitals:   11/26/19 0530 11/26/19 0615  BP: (!) 151/73   Pulse: (!) 49   Resp: 17   Temp:  98.4 F (36.9 C)  SpO2: 98%     CONSTITUTIONAL:  well-appearing, NAD NEURO:  Alert and oriented x 3, no focal deficits EYES:  pupils equal and reactive ENT/NECK:  trachea midline, no JVD CARDIO:  brady rate, reg rhythm, well-perfused PULM:  None labored breathing GI/GU:  Abdomen non-distended MSK/SPINE:  No gross deformities, no edema SKIN:  no rash, atraumatic PSYCH:  Appropriate speech and behavior   EKG Interpretation  Date/Time:  Thursday November 26 2019 05:19:30 EDT Ventricular Rate:  49 PR Interval:    QRS Duration: 154 QT Interval:  472 QTC Calculation: 427 R Axis:   -26 Text Interpretation: Sinus bradycardia Prolonged PR interval Left bundle branch block Abnormal ECG Confirmed by Carmin Muskrat [phone]) on 11/27/2019 10:51:58 AM       Work up grossly reassuring other than mild AKI and patient being bradycardic. He remained HDS and asymptomatic.  Discussed with cardiology, who recommended DC if asymp with close follow up in the clinic.      Fatima Blank, MD 11/29/19 2102

## 2019-12-02 ENCOUNTER — Telehealth: Payer: Self-pay

## 2019-12-02 NOTE — Telephone Encounter (Signed)
Diagnosis: Syncope should approve the monitor. If not, okay to hold off for now. CC/JG, could you follow up during upcoming OV? Please see 10/14 tel encounter for your reference.  11/26/2019 Patient with paroxysmal Afib, on flecainide. He was in the ED overnight with two episodes of brief syncope, preceeded by prodromal symptoms. He was found to be briefly bradycardic in 20s by EMS. HR stayed >40 bpm in sinus rhythm while in the ED. No ventricular arrhythmia notes. He was hydrated in the ED. He ambulated without any symptoms. Cr mildly elevated s/o AKI, likely from dehydration. Patient is being discharged from the ED.  Recommend two week live monitor for evaluation of syncope. Order placed. F/u w/Dr. Francis Gaines, PA after that   Thanks MJP

## 2019-12-02 NOTE — Telephone Encounter (Signed)
I received a phone call from the monitor company. They are denying his monitor, they will send for additional review.

## 2019-12-03 NOTE — Telephone Encounter (Signed)
Will he be done with his monitor by then? Seems like cutting it close.

## 2019-12-03 NOTE — Telephone Encounter (Signed)
Did peer to peer and monitor should be approved now. Please make sure he gets scheudled for appt.  Thanks!

## 2019-12-04 NOTE — Telephone Encounter (Signed)
Thank you :)

## 2019-12-10 ENCOUNTER — Ambulatory Visit: Payer: Medicare PPO | Admitting: Student

## 2019-12-17 NOTE — Progress Notes (Addendum)
Primary Physician/Referring:  Christain Sacramento, MD  Patient ID: [name], male    DOB: Mar 05, 1945, 74 y.o.   MRN: [id number]  Chief Complaint  Patient presents with  . Syncope and collapse  . Follow-up  . Results    monitor   HPI:    [name] STATES  is a 74 y.o. Caucasian male with  h/o remote A. Fibrillation  In 2013 without recurrence and maintains sinus on Flecainide 50 mg BID chronically. (converted to sinus on Flecainide and metoprolol and discontinued excessive coffee drinking).  Patient has since stopped metoprolol due to bradycardia.  Patient presents for follow-up after he was evaluated in the ED on 11/25/2019 for an episode suggestive of vasovagal syncope.  As a result he underwent 2-week cardiac event monitor.  Patient reports that he went to the emergency department for evaluation following an episode of syncope.  Patient was working in his yard throughout the day, without staying hydrated.  He then came in and ate a heavy meal and when he got up from the table felt dizzy, he laid down for a few minutes and felt better.  He then returned to dinner to finish eating and while he was sitting at the table began to feel dizzy and lightheaded, he then states that he fell out of his chair and his wife lowered him to the floor, but he does not recall this.  Patient states that when he woke up he still felt dizzy, and his dizziness resolved and about 20 minutes.  During the episode he experienced urinary incontinence.  His wife called EMS and they transported him to the emergency department where he underwent evaluation.  In the ED he was noted to be bradycardic with a mild AKI, however syncopal episode was presumed to be vasovagal and he was discharged home.  Patient has had no recurrence of dizziness, syncope, near syncope since 11/25/2019.  He does state he has a high salt diet and drinks 2-3 alcoholic beverages daily.  He does not monitor his blood pressure on a regular basis at home.  He  has been active since his evaluation in the emergency department, playing tennis a few times per week.  He has had no known recurrence of A. fib since using flecainide.  Past Medical History:  Diagnosis Date  . 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 Atypical nevus 03/22/1998   moderate atypia - upper mid back (widershave)  . Atypical nevus 03/14/1999   slight atypia - left flank  . Atypical nevus 01/01/2006   moderate atypia - left outer scapula  . Atypical nevus 02/03/2007   slight atypia - left chest  . Atypical nevus 06/02/2008   atypical proliferation - left scapula - excision  . 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 Squamous cell carcinoma of skin 10/13/2003   right cheek - treated10/07/2003  . Squamous cell carcinoma of skin 06/05/2005   left collarbone - MOHs  . Squamous cell carcinoma of skin 12/15/2008   top right ear - CX3 + 5FU  . Squamous cell carcinoma, face 03/01/2015   right cheek -  CX3 + 5FU  . 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 APPENDECTOMY     Social History   Tobacco Use  . Smoking status: Former Smoker    Packs/day: 1.00    Years: 17.00    Pack years: 17.00    Types: Cigarettes    Quit date: 07/02/1967    Years since quitting: 52.4  . Smokeless tobacco: Former Systems developer    Quit date: 07/01/1969  Substance Use Topics  . Alcohol use: Yes    Alcohol/week: 2.0 standard drinks    Types: 2 Cans of beer per week    Comment: nightly  Married    ROS  Review of Systems  Constitutional: Negative for malaise/fatigue and weight gain.   Cardiovascular: Negative for chest pain, claudication, dyspnea on exertion, leg swelling, near-syncope, orthopnea, palpitations, paroxysmal nocturnal dyspnea and syncope.  Respiratory: Negative for shortness of breath.   Hematologic/Lymphatic: Does not bruise/bleed easily.  Gastrointestinal: Negative for melena.  Neurological: Negative for dizziness and weakness.   Objective  Blood pressure (!) 160/84, pulse 65, resp. rate 16, height 6' (1.829 m), weight 199 lb (90.3 kg), SpO2 98 %.  Vitals with BMI 12/18/2019 12/18/2019 11/26/2019  Height - 6' 0"  -  Weight - 199 lbs -  BMI - 02.33 -  Systolic 435 686 168  Diastolic 84 83 73  Pulse - 65 49    Orthostatic VS for the past 72 hrs (Last 3 readings):  Orthostatic BP Patient Position BP Location Cuff Size Orthostatic Pulse  12/18/19 1131 -- Sitting Left Arm Normal --  12/18/19 1108 153/84 Standing Left Arm Normal 55  12/18/19 1107 169/83 Sitting Left Arm Normal 54  12/18/19 1105 171/81 Supine Left Arm Normal 54    Physical Exam Vitals reviewed.  HENT:     Head: Normocephalic and atraumatic.  Cardiovascular:     Rate and Rhythm: Normal rate and regular rhythm.     Pulses: Intact distal pulses.     Heart sounds: Normal heart sounds, S1 normal and S2 normal. No murmur heard.  No gallop.      Comments: No leg edema, no JVD. Pulmonary:     Effort: Pulmonary effort is normal. No respiratory distress.     Breath sounds: Normal breath sounds. No wheezing, rhonchi or rales.  Abdominal:     General: Bowel sounds are normal.     Palpations: Abdomen is soft.  Musculoskeletal:        General: Normal range of motion.     Right lower leg: No edema.     Left lower leg: No edema.  Neurological:     Mental Status: He is alert.    Laboratory examination:   Recent Labs    01/26/19 1119 05/25/19 0941 11/25/19 2343  NA 139 138 136  K 4.7 4.5 4.2  CL 103 101 105  CO2 24 22 24   GLUCOSE 95 93 130*  BUN 14 14 23   CREATININE 0.91 0.99  1.41*  CALCIUM 9.6 9.4 9.1  GFRNONAA 82 74 48*  GFRAA 95 85  --    CrCl cannot be calculated (Patient's most recent lab result is older than the maximum 21 days allowed.).  CMP Latest Ref Rng & Units 11/25/2019 05/25/2019 01/26/2019  Glucose 70 - 99 mg/dL 130(H) 93 95  BUN 8 - 23 mg/dL 23 14 14   Creatinine 0.61 - 1.24 mg/dL 1.41(H) 0.99 0.91  Sodium  135 - 145 mmol/L 136 138 139  Potassium 3.5 - 5.1 mmol/L 4.2 4.5 4.7  Chloride 98 - 111 mmol/L 105 101 103  CO2 22 - 32 mmol/L 24 22 24   Calcium 8.9 - 10.3 mg/dL 9.1 9.4 9.6  Total Protein 6.5 - 8.1 g/dL 6.8 - 6.8  Total Bilirubin 0.3 - 1.2 mg/dL 0.8 - 0.5  Alkaline Phos 38 - 126 U/L 37(L) - 46  AST 15 - 41 U/L 21 - 25  ALT 0 - 44 U/L 23 - 27   CBC Latest Ref Rng & Units 11/25/2019 01/26/2019 09/25/2007  WBC 4.0 - 10.5 K/uL 10.7(H) 7.7 -  Hemoglobin 13.0 - 17.0 g/dL 12.9(L) 14.2 12.9(L)  Hematocrit 39 - 52 % 39.4 43.2 38.0(L)  Platelets 150 - 400 K/uL 250 292 -   Lipid Panel Recent Labs    01/26/19 1119  CHOL 162  TRIG 95  LDLCALC 74  HDL 71    HEMOGLOBIN A1C No results found for: HGBA1C, MPG TSH No results for input(s): TSH in the last 8760 hours.   External labs:  Labs 02/04/2018: Total cholesterol 187, triglycerides 95, HDL 57, LDL 111.  HB 14.6/HCT 42.6, platelets 280, normal indicis.  Serum glucose 101 mg, BUN 15, creatinine 0.99, eGFR greater than 60 mL, potassium 5.0.  CMP otherwise normal.  Medications and allergies  No Known Allergies   Current Outpatient Medications on File Prior to Visit  Medication Sig Dispense Refill  . aspirin EC 81 MG tablet Take 81 mg by mouth daily. Pt reports taking 1/2 tablet daily.    . flecainide (TAMBOCOR) 50 MG tablet TAKE ONE AND ONE-HALF TABLET BY MOUTH DAILY 135 tablet 3  . omeprazole (PRILOSEC) 20 MG capsule Take 20 mg by mouth daily.    . simvastatin (ZOCOR) 40 MG tablet Take 1 tablet (40 mg total) by mouth at bedtime. 90 tablet 4   No current facility-administered  medications on file prior to visit.    Radiology:  No results found.  Cardiac Studies:   Stress EKG 01/23/11: Normal stress EKG. ST dep back to baseline with exercise of 2.5 mm back to baseline at 1: 40 minutes into recovery. 7 minutes and 10 METs.   Echocardiogram 01/08/2017: Left ventricle cavity is normal in size. Mild concentric hypertrophy of the left ventricle. Normal global wall motion. Calculated EF 61%. Left atrial cavity is mildly dilated. Mild (Grade I) mitral regurgitation. No significant change compared to prior study in 2012.  Mobile cardiac telemetry 14 days 11/27/2019 - 12/11/2019: Dominant rhythm: Sinus with first degree AV block HR 45-144 bpm. Avg HR 66 bpm. 19 episodes of SVT, fastest at 126 bpm for 8 beats, longest for 12 beats at 102 bpm. <1% isolated SVE, couplet/triplets. <1% isolated VE, couplet/triplets. No atrial fibrillation/atrial flutter/VT/high grade AV block, sinus pause >3sec noted. 1 patient triggered event correlates with sinus rhythm/artifact  EKG:   EKG 11/18/2019: Sinus rhythm with first-degree AV block at rate of 61 bpm, left axis deviation, left intrafascicular block.  Poor R wave progression, cannot exclude anteroseptal infarct old.  IVCD, incomplete left bundle branch block.  Borderline criteria for LVH.  No significant change from 02/02/2019.  Assessment     ICD-10-CM   1. Essential hypertension  I10 valsartan (DIOVAN) 320 MG tablet  2. Syncope and collapse  R55   3. Paroxysmal atrial fibrillation (HCC)  I48.0   4. Pure hypercholesterolemia  E78.00     CHA2DS2-VASc Score is 3.  Yearly risk of stroke: 3.2% (  A, HTN).  Score of 1=0.6; 2=2.2; 3=3.2; 4=4.8; 5=7.2; 6=9.8; 7=>9.8) -(CHF; HTN; vasc disease DM,  Male = 1; Age <65 =0; 65-74 = 1,  >75 =2; stroke/embolism= 2).  Recommendations:   Meds ordered this encounter  Medications  . valsartan (DIOVAN) 320 MG tablet    Sig: Take 1 tablet (320 mg total) by mouth daily.    Dispense:  90  tablet    Refill:  1    Patient will finish Valsartan HCT and will pick up Valsartan plain next month    Medications Discontinued During This Encounter  Medication Reason  . valsartan (DIOVAN) 320 MG tablet Change in therapy    ZAKARI BATHE  is a 74 y.o. Caucasian male with  h/o remote A. Fibrillation  In 2013 without recurrence and maintains sinus on Flecainide 50 mg BID chronically. (converted to sinus on Flecainide and metoprolol and discontinued excessive coffee drinking. No longer on metoprolol due to bradycardia.  He has previously been on HCTZ, this has been stopped due to fatigue and dizziness.  Patient presents for follow-up after recent evaluation in the emergency department on 11/25/2019 for an episode of syncope.  Symptoms are suggestive of vasovagal syncope, and cardiac monitoring is reassuring syncopal episode is not related to cardiac etiology.  Reviewed and discussed the results of 2-week cardiac monitor with patient.  He did have episodes of nonsustained SVT, these were asymptomatic.  As patient has had no recurrence of symptoms of dizziness or syncope and brief episodes of SVT on monitor are also asymptomatic, do not feel it is necessary to add calcium channel blocker at this time although it could be considered in the future.  Today patient's blood pressure was markedly elevated initially, upon recheck it improved but is still above goal.  At his last visit in our office changes were made to his antihypertensive medications, however he was instructed to finish the medication he currently had available.  Therefore patient has been taking 1 full tablet of valsartan/HCT and 1/2 tablet every other day.  Patient will switch to valsartan 320 mg daily and I will see him back in 6 weeks to reevaluate blood pressure control.   Will obtain repeat lipid profile testing prior to next visit.    Alethia Berthold, PA-C 12/18/2019, 12:39 PM Office: [phone]

## 2019-12-18 ENCOUNTER — Other Ambulatory Visit: Payer: Self-pay

## 2019-12-18 ENCOUNTER — Ambulatory Visit: Payer: Medicare PPO | Admitting: Student

## 2019-12-18 ENCOUNTER — Encounter: Payer: Self-pay | Admitting: Student

## 2019-12-18 VITALS — BP 160/84 | HR 65 | Resp 16 | Ht 72.0 in | Wt 199.0 lb

## 2019-12-18 DIAGNOSIS — I480 Paroxysmal atrial fibrillation: Secondary | ICD-10-CM

## 2019-12-18 DIAGNOSIS — R55 Syncope and collapse: Secondary | ICD-10-CM

## 2019-12-18 DIAGNOSIS — I10 Essential (primary) hypertension: Secondary | ICD-10-CM

## 2019-12-18 DIAGNOSIS — E7800 Pure hypercholesterolemia, unspecified: Secondary | ICD-10-CM

## 2019-12-18 MED ORDER — VALSARTAN 320 MG PO TABS: 320.0000 mg | ORAL_TABLET | Freq: Every day | ORAL | 1 refills | Status: DC

## 2020-01-18 ENCOUNTER — Other Ambulatory Visit: Payer: Self-pay

## 2020-01-18 ENCOUNTER — Ambulatory Visit: Payer: Medicare PPO | Admitting: Dermatology

## 2020-01-18 DIAGNOSIS — Z86007 Personal history of in-situ neoplasm of skin: Secondary | ICD-10-CM | POA: Diagnosis not present

## 2020-01-18 DIAGNOSIS — Z8589 Personal history of malignant neoplasm of other organs and systems: Secondary | ICD-10-CM

## 2020-01-18 DIAGNOSIS — Z86006 Personal history of melanoma in-situ: Secondary | ICD-10-CM

## 2020-01-18 DIAGNOSIS — L570 Actinic keratosis: Secondary | ICD-10-CM

## 2020-01-18 DIAGNOSIS — Z1283 Encounter for screening for malignant neoplasm of skin: Secondary | ICD-10-CM | POA: Diagnosis not present

## 2020-01-18 DIAGNOSIS — Z85828 Personal history of other malignant neoplasm of skin: Secondary | ICD-10-CM

## 2020-01-21 ENCOUNTER — Encounter: Payer: Self-pay | Admitting: Dermatology

## 2020-01-22 NOTE — Progress Notes (Signed)
   Follow-Up Visit   Subjective  [name] is a 74 y.o. male who presents for the following: Annual Exam (COncerns right cheek. left cheek lots of red lesions. Patient says he may need light treatent again. ).  General skin examination Location:  Duration:  Quality:  Associated Signs/Symptoms: Modifying Factors:  Severity:  Timing: Context: History of multiple primary melanomas.  Objective  Well appearing patient in no apparent distress; mood and affect are within normal limits. Objective  Mid Back: Full body skin exam: No atypical moles, melanoma, or nonmobile skin cancer.  Objective  Left Upper Arm - Posterior, Left Upper Back, Mid Back: Patient has had upper mid back, left upper arm. And left scapula MIS. All treated with mohs.  All clinically clear.  No adenopathy.  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  Objective  Left Preauricular Area, Left Supraclavicular Area, Right Ear, Right Nasal Sidewall: Multiple SCCs.  No recurrence  Objective  Head - Anterior (Face): Pink horn like crusts.    A full examination was performed including scalp, head, eyes, ears, nose, lips, neck, chest, axillae, abdomen, back, buttocks, bilateral upper extremities, bilateral lower extremities, hands, feet, fingers, toes, fingernails, and toenails. All findings within normal limits unless otherwise noted below.   Assessment & Plan    Screening for malignant neoplasm of skin Mid Back  Yearly skin exams.  Encouraged to examine his own skin twice annually.  Personal history of melanoma in-situ (3) Left Upper Arm - Posterior; Left Upper Back; Mid Back  Patient needs yearly skin exams.   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 Scars look clear.   History of squamous cell carcinoma (4) Right Ear; Left Preauricular Area; Right Nasal Sidewall; Left Supraclavicular Area  Yearly skin exam. Scars look clear.    AK (actinic keratosis) Head - Anterior (Face)  Patient will come back after the holidays for Red light PDT on face.   Skin cancer screening performed today.     I, Lavonna Monarch, MD, have reviewed all documentation for this visit.  The documentation on 01/22/20 for the exam, diagnosis, procedures, and orders are all accurate and complete.

## 2020-01-27 NOTE — Progress Notes (Signed)
Primary Physician/Referring:  Christain Sacramento, MD  Patient ID: [name], male    DOB: 01-31-1946, 74 y.o.   MRN: [id number]  No chief complaint on file.  HPI:    [name]  is a 74 y.o. Caucasian male with  h/o remote A. Fibrillation  In 2013 without recurrence and maintains sinus on Flecainide 50 mg BID chronically. (converted to sinus on Flecainide and metoprolol and discontinued excessive coffee drinking).  Patient has since stopped metoprolol due to bradycardia. Patient had episode of vasovagal syncope 11/25/2019, no recurrence since then.  He has had no known recurrence of A. fib since using flecainide.  Patient presents for 6 week follow up of hypertension and hyperlipidemia. At last visit switched patient to valsartan 320 mg daily.  However patient has been taking 1 full tablet every other day and half tablet every other day.  He reports home blood pressure readings 135/70s average.  He is presently asymptomatic.  Denies chest pain, dizziness, palpitations, syncope. He reports he is tolerating 320 mg valsartan well when he takes it. He does continue to have salt in his diet.  He continues to play tennis a few times per week without issue.  Past Medical History:  Diagnosis Date  . 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 Atypical nevus 03/22/1998   moderate atypia - upper mid back (widershave)  . Atypical nevus 03/14/1999   slight atypia - left flank  . Atypical nevus 01/01/2006   moderate atypia - left outer scapula  . Atypical nevus 02/03/2007   slight atypia - left chest  . Atypical nevus 06/02/2008   atypical proliferation - left scapula - excision  . 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 Malignant melanoma in situ  ([place]) 06/05/2005   left scapula - MOHs  . Squamous cell carcinoma of skin 10/13/2003   right cheek - treated10/07/2003  . Squamous cell carcinoma of skin 06/05/2005   left collarbone - MOHs  . Squamous cell carcinoma of skin 12/15/2008   top right ear - CX3 + 5FU  . 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 APPENDECTOMY     Social History   Tobacco Use  . Smoking status: Former Smoker    Packs/day: 1.00    Years: 17.00    Pack years: 17.00    Types: Cigarettes    Quit date: 07/02/1967    Years since quitting: 52.6  . Smokeless tobacco: Former Systems developer    Quit date: 07/01/1969  Substance Use Topics  . Alcohol use: Yes    Alcohol/week: 2.0 standard drinks    Types: 2 Cans of beer per week    Comment: nightly  Married    ROS  Review of Systems  Constitutional: Negative for malaise/fatigue and weight gain.  Cardiovascular: Negative for chest pain, claudication, dyspnea on exertion, leg swelling, near-syncope, orthopnea, palpitations, paroxysmal nocturnal dyspnea and syncope.  Respiratory: Negative for shortness of breath.   Hematologic/Lymphatic: Does not bruise/bleed easily.  Gastrointestinal: Negative for melena.  Neurological:  Negative for dizziness and weakness.   Objective  There were no vitals taken for this visit.  Vitals with BMI 12/18/2019 12/18/2019 11/26/2019  Height - 6' 0"  -  Weight - 199 lbs -  BMI - 81.19 -  Systolic 147 829 562  Diastolic 84 83 73  Pulse - 65 49    No data found.  Physical Exam Vitals reviewed.  HENT:     Head: Normocephalic and atraumatic.  Cardiovascular:     Rate and Rhythm: Normal rate and regular rhythm.     Pulses: Intact distal pulses.     Heart sounds: Normal heart sounds, S1 normal and S2 normal. No murmur heard. No gallop.      Comments: No leg edema, no JVD. Pulmonary:      Effort: Pulmonary effort is normal. No respiratory distress.     Breath sounds: Normal breath sounds. No wheezing, rhonchi or rales.  Abdominal:     General: Bowel sounds are normal.     Palpations: Abdomen is soft.  Musculoskeletal:        General: Normal range of motion.     Right lower leg: No edema.     Left lower leg: No edema.  Neurological:     Mental Status: He is alert.    Laboratory examination:   Recent Labs    05/25/19 0941 11/25/19 2343  NA 138 136  K 4.5 4.2  CL 101 105  CO2 22 24  GLUCOSE 93 130*  BUN 14 23  CREATININE 0.99 1.41*  CALCIUM 9.4 9.1  GFRNONAA 74 48*  GFRAA 85  --    CrCl cannot be calculated (Patient's most recent lab result is older than the maximum 21 days allowed.).  CMP Latest Ref Rng & Units 11/25/2019 05/25/2019 01/26/2019  Glucose 70 - 99 mg/dL 130(H) 93 95  BUN 8 - 23 mg/dL 23 14 14   Creatinine 0.61 - 1.24 mg/dL 1.41(H) 0.99 0.91  Sodium 135 - 145 mmol/L 136 138 139  Potassium 3.5 - 5.1 mmol/L 4.2 4.5 4.7  Chloride 98 - 111 mmol/L 105 101 103  CO2 22 - 32 mmol/L 24 22 24   Calcium 8.9 - 10.3 mg/dL 9.1 9.4 9.6  Total Protein 6.5 - 8.1 g/dL 6.8 - 6.8  Total Bilirubin 0.3 - 1.2 mg/dL 0.8 - 0.5  Alkaline Phos 38 - 126 U/L 37(L) - 46  AST 15 - 41 U/L 21 - 25  ALT 0 - 44 U/L 23 - 27   CBC Latest Ref Rng & Units 11/25/2019 01/26/2019 09/25/2007  WBC 4.0 - 10.5 K/uL 10.7(H) 7.7 -  Hemoglobin 13.0 - 17.0 g/dL 12.9(L) 14.2 12.9(L)  Hematocrit 39.0 - 52.0 % 39.4 43.2 38.0(L)  Platelets 150 - 400 K/uL 250 292 -   Lipid Panel No results for input(s): CHOL, TRIG, LDLCALC, VLDL, HDL, CHOLHDL, LDLDIRECT in the last 8760 hours.  HEMOGLOBIN A1C No results found for: HGBA1C, MPG TSH No results for input(s): TSH in the last 8760 hours.   External labs:  Labs 02/04/2018: Total cholesterol 187, triglycerides 95, HDL 57, LDL 111.  HB 14.6/HCT 42.6, platelets 280, normal indicis.  Serum glucose 101 mg, BUN 15, creatinine 0.99, eGFR greater  than 60 mL, potassium 5.0.  CMP otherwise normal.  Medications and allergies  No Known Allergies   Current Outpatient Medications on File Prior to Visit  Medication Sig Dispense Refill  . aspirin EC 81 MG tablet Take 81 mg by mouth daily. Pt reports taking 1/2 tablet daily.    Marland Kitchen  flecainide (TAMBOCOR) 50 MG tablet TAKE ONE AND ONE-HALF TABLET BY MOUTH DAILY 135 tablet 3  . omeprazole (PRILOSEC) 20 MG capsule Take 20 mg by mouth daily.    . simvastatin (ZOCOR) 40 MG tablet Take 1 tablet (40 mg total) by mouth at bedtime. 90 tablet 4  . valsartan (DIOVAN) 320 MG tablet Take 1 tablet (320 mg total) by mouth daily. 90 tablet 1   No current facility-administered medications on file prior to visit.    Radiology:  No results found.  Cardiac Studies:   Stress EKG 01/23/11: Normal stress EKG. ST dep back to baseline with exercise of 2.5 mm back to baseline at 1: 40 minutes into recovery. 7 minutes and 10 METs.   Echocardiogram 01/08/2017: Left ventricle cavity is normal in size. Mild concentric hypertrophy of the left ventricle. Normal global wall motion. Calculated EF 61%. Left atrial cavity is mildly dilated. Mild (Grade I) mitral regurgitation. No significant change compared to prior study in 2012.  Mobile cardiac telemetry 14 days 11/27/2019 - 12/11/2019: Dominant rhythm: Sinus with first degree AV block HR 45-144 bpm. Avg HR 66 bpm. 19 episodes of SVT, fastest at 126 bpm for 8 beats, longest for 12 beats at 102 bpm. <1% isolated SVE, couplet/triplets. <1% isolated VE, couplet/triplets. No atrial fibrillation/atrial flutter/VT/high grade AV block, sinus pause >3sec noted. 1 patient triggered event correlates with sinus rhythm/artifact  EKG:   EKG 11/18/2019: Sinus rhythm with first-degree AV block at rate of 61 bpm, left axis deviation, left intrafascicular block.  Poor R wave progression, cannot exclude anteroseptal infarct old.  IVCD, incomplete left bundle branch block.   Borderline criteria for LVH.  No significant change from 02/02/2019.  Assessment     ICD-10-CM   1. Essential hypertension  I10   2. Pure hypercholesterolemia  E78.00     No orders of the defined types were placed in this encounter.  There are no discontinued medications.  Recommendations:   [name]  is a 74 y.o. Caucasian male with  h/o remote A. Fibrillation  In 2013 without recurrence and maintains sinus on Flecainide 50 mg BID chronically. (converted to sinus on Flecainide and metoprolol and discontinued excessive coffee drinking).  Patient has since stopped metoprolol due to bradycardia. Patient had episode of vasovagal syncope 11/25/2019, no recurrence since then.  He has had no known recurrence of A. fib since using flecainide.  Patient presents for 6-week follow-up of hypertension.  Blood pressure readings are still borderline elevated.  Recommend patient take a full tablet of valsartan 320 mg once daily.  Patient will continue to monitor his blood pressure on a regular basis, he will notify the office if it is > 140/90.  Will obtain BMP in 1 week since increasing dose to full tablet of valsartan daily.  Patient also does not have recent lipid profile testing, will obtain this.    As patient has had no recurrence of symptoms of dizziness or syncope and brief episodes of SVT on monitor are also asymptomatic, do not feel it is necessary to add calcium channel blocker at this time although it could be considered in the future.  Counseled patient again regarding heart healthy low-sodium diet as well as weight loss, which would improve blood pressure control.   Follow up in 6 months for atrial fibrillation, hypertension.    Alethia Berthold, PA-C 01/29/2020, 12:52 PM Office: [phone]

## 2020-01-29 ENCOUNTER — Other Ambulatory Visit: Payer: Self-pay

## 2020-01-29 ENCOUNTER — Encounter: Payer: Self-pay | Admitting: Student

## 2020-01-29 ENCOUNTER — Ambulatory Visit: Payer: Medicare PPO | Admitting: Student

## 2020-01-29 VITALS — BP 146/68 | HR 53 | Resp 16 | Ht 72.0 in | Wt 203.0 lb

## 2020-01-29 DIAGNOSIS — I10 Essential (primary) hypertension: Secondary | ICD-10-CM

## 2020-01-29 DIAGNOSIS — E7800 Pure hypercholesterolemia, unspecified: Secondary | ICD-10-CM

## 2020-03-02 ENCOUNTER — Ambulatory Visit (INDEPENDENT_AMBULATORY_CARE_PROVIDER_SITE_OTHER): Payer: Medicare PPO

## 2020-03-02 ENCOUNTER — Other Ambulatory Visit: Payer: Self-pay

## 2020-03-02 DIAGNOSIS — L570 Actinic keratosis: Secondary | ICD-10-CM | POA: Diagnosis not present

## 2020-03-02 MED ORDER — AMINOLEVULINIC ACID HCL 10 % EX GEL: 2000.0000 mg | Freq: Once | CUTANEOUS | Status: DC

## 2020-03-02 NOTE — Patient Instructions (Signed)

## 2020-03-02 NOTE — Progress Notes (Signed)
Photodynamic Therapy Procedure Note Diagnosis: Actinic keratosis Location: Face Informed Consent: Discussed risks (burning, pain, redness, peeling, severe sunburn-like reaction, blistering, discoloration, lack of resolution) and benefits of the procedure, as well as the alternatives. Informed consent was obtained. Preparation: After cleansing the skin, the area to be treated was coated with Levulan.  This was allowed to sit on the skin for 90 minutes. Procedure Details: The patient was placed under the light source with appropriate eye protection for 20 minutes. After completing the treatment, the patient applied sunscreen to the treated areas. Patient tolerated the procedure well Plan: Avoid any sun exposure for the next 24 hours. Wear sunscreen daily for the next week. Observe normal sun precautions thereafter. Recommend OTC analgesia as needed for pain. Follow-up in 10 weeks.  

## 2020-03-08 ENCOUNTER — Ambulatory Visit: Payer: Medicare PPO | Admitting: Dermatology

## 2020-04-04 ENCOUNTER — Other Ambulatory Visit: Payer: Self-pay

## 2020-04-04 DIAGNOSIS — I10 Essential (primary) hypertension: Secondary | ICD-10-CM

## 2020-04-04 MED ORDER — VALSARTAN 320 MG PO TABS: 320.0000 mg | ORAL_TABLET | Freq: Every day | ORAL | 1 refills | Status: DC

## 2020-04-06 ENCOUNTER — Telehealth: Payer: Self-pay

## 2020-04-06 ENCOUNTER — Other Ambulatory Visit: Payer: Self-pay | Admitting: Student

## 2020-04-06 MED ORDER — FLECAINIDE ACETATE 50 MG PO TABS
75.0000 mg | ORAL_TABLET | Freq: Every day | ORAL | 3 refills | Status: DC
Start: 2020-04-06 — End: 2021-04-07

## 2020-04-06 NOTE — Telephone Encounter (Signed)
I have sent it. Do you know how to put in a refill request to send to me for approval?

## 2020-04-06 NOTE — Telephone Encounter (Signed)
Gotcha, one of the MA who does may be able to show you if you'd like. But this one has been taken care of.

## 2020-04-06 NOTE — Telephone Encounter (Signed)
Cecilia and I do not know how to

## 2020-04-06 NOTE — Telephone Encounter (Signed)
Patients pharmacy requested refill for Flecainide Acetate 50mg 

## 2020-05-06 LAB — PSA: Prostate Specific Ag, Serum: 0.4 ng/mL (ref 0.0–4.0)

## 2020-05-06 LAB — CMP14+EGFR
ALT: 23 IU/L (ref 0–44)
AST: 22 IU/L (ref 0–40)
Albumin/Globulin Ratio: 2.3 — ABNORMAL HIGH (ref 1.2–2.2)
Albumin: 4.8 g/dL — ABNORMAL HIGH (ref 3.7–4.7)
Alkaline Phosphatase: 43 IU/L — ABNORMAL LOW (ref 44–121)
BUN/Creatinine Ratio: 20 (ref 10–24)
BUN: 18 mg/dL (ref 8–27)
Bilirubin Total: 0.5 mg/dL (ref 0.0–1.2)
CO2: 22 mmol/L (ref 20–29)
Calcium: 9.8 mg/dL (ref 8.6–10.2)
Chloride: 102 mmol/L (ref 96–106)
Creatinine, Ser: 0.9 mg/dL (ref 0.76–1.27)
Globulin, Total: 2.1 g/dL (ref 1.5–4.5)
Glucose: 102 mg/dL — ABNORMAL HIGH (ref 65–99)
Potassium: 4.8 mmol/L (ref 3.5–5.2)
Sodium: 137 mmol/L (ref 134–144)
Total Protein: 6.9 g/dL (ref 6.0–8.5)
eGFR: 88 mL/min/{1.73_m2} (ref >59)

## 2020-05-06 LAB — LIPID PANEL WITH LDL/HDL RATIO
Cholesterol, Total: 192 mg/dL (ref 100–199)
HDL: 75 mg/dL (ref >39)
LDL Chol Calc (NIH): 103 mg/dL — ABNORMAL HIGH (ref 0–99)
LDL/HDL Ratio: 1.4 ratio (ref 0.0–3.6)
Triglycerides: 79 mg/dL (ref 0–149)
VLDL Cholesterol Cal: 14 mg/dL (ref 5–40)

## 2020-05-07 NOTE — Progress Notes (Signed)
You saw him. CMP normal and LDL minimally elevated, no significant risk factors except age and HTN. CHA2DS2-VASc Score is 3.  Yearly risk of stroke: 3.2% (A, HTN).  Score of 1=0.6; 2=2.2; 3=3.2; 4=4.8; 5=7.2; 6=9.8; 7=>9.8) -(CHF; HTN; vasc disease DM,  Male = 1; Age <65 =0; 65-74 = 1,  >75 =2; stroke/embolism= 2).

## 2020-05-09 NOTE — Progress Notes (Signed)
Please inform patient kidney function and electrolytes normal. LDL is mildly elevated. Please advise patient to avoid fatty foods and red meat.

## 2020-05-10 NOTE — Progress Notes (Signed)
Patient called back, I have discussed results with him. Patient stated that he has been working on exercising more and watching what he eats. He just wanted you to know.

## 2020-05-10 NOTE — Progress Notes (Signed)
Attempted to call pt. No answer, left VM requesting call back.

## 2020-05-11 ENCOUNTER — Other Ambulatory Visit: Payer: Self-pay

## 2020-05-11 ENCOUNTER — Ambulatory Visit: Payer: Medicare PPO | Admitting: Dermatology

## 2020-05-11 ENCOUNTER — Encounter: Payer: Self-pay | Admitting: Dermatology

## 2020-05-11 DIAGNOSIS — L570 Actinic keratosis: Secondary | ICD-10-CM | POA: Diagnosis not present

## 2020-05-19 ENCOUNTER — Other Ambulatory Visit: Payer: Self-pay

## 2020-05-19 ENCOUNTER — Ambulatory Visit: Payer: Medicare PPO | Admitting: Cardiology

## 2020-05-19 ENCOUNTER — Ambulatory Visit: Payer: Medicare PPO | Admitting: Student

## 2020-05-19 ENCOUNTER — Encounter: Payer: Self-pay | Admitting: Student

## 2020-05-19 VITALS — BP 128/72 | HR 67 | Temp 97.8°F | Resp 16 | Ht 72.0 in | Wt 202.0 lb

## 2020-05-19 DIAGNOSIS — E7800 Pure hypercholesterolemia, unspecified: Secondary | ICD-10-CM

## 2020-05-19 DIAGNOSIS — I10 Essential (primary) hypertension: Secondary | ICD-10-CM

## 2020-05-19 DIAGNOSIS — I480 Paroxysmal atrial fibrillation: Secondary | ICD-10-CM

## 2020-05-19 MED ORDER — METAMUCIL 0.52 G PO CAPS: 0.5200 g | ORAL_CAPSULE | Freq: Every day | ORAL | 3 refills | Status: DC

## 2020-05-19 NOTE — Progress Notes (Signed)
Primary Physician/Referring:  Christain Sacramento, MD  Patient ID: [name], male    DOB: 15-Jan-1946, 75 y.o.   MRN: [id number]  Chief Complaint  Patient presents with  . Atrial Fibrillation  . Follow-up    6 month   HPI:    [name]  is a 75 y.o. Caucasian male with  h/o remote A. Fibrillation  In 2013 without recurrence and maintains sinus on Flecainide 50 mg BID chronically. (converted to sinus on Flecainide and metoprolol and discontinued excessive coffee drinking).  Patient has since stopped metoprolol due to bradycardia. Patient had episode of vasovagal syncope 11/25/2019, no recurrence since then.  He has had no known recurrence of A. fib since using flecainide.  Patient presents for 45-monthfollow-up of atrial fibrillation and hypertension.  At last visit increased valsartan to 320 mg daily however he has continued to take valsartan a full tablet every other day and 1/2 tablet every other day. Renal function has remained stable.  Patient remains asymptomatic.  Denies chest pain, dizziness, palpitations, syncope, dyspnea.  Denies leg edema, orthopnea, PND.  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  He also has been reducing his alcohol intake, drinking a maximum of 2 drinks per day.  Past Medical History:  Diagnosis Date  . 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 Atypical nevus 03/22/1998   moderate atypia - upper mid back (widershave)  . Atypical nevus 03/14/1999   slight atypia - left flank  . Atypical nevus 01/01/2006   moderate atypia - left outer scapula  . Atypical nevus 02/03/2007   slight atypia - left chest  . Atypical nevus 06/02/2008   atypical proliferation - left scapula - excision  . 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, [place] 05[place]2004   left upper arm - [facility]  . [place]  [place]13[place]   upper mid back - [facility]  . 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 06/05[place]2005   left scapula - M[facility]  . Squamous cell carcinoma of skin 10/13/2003   right cheek - treated10/07/2003  . Squamous cell carcinoma of skin 06/05/2005   left collarbone - M[facility]  . Squamous cell carcinoma of skin 12/15/2008   top right ear - CX3 + 5FU  . 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 APPENDECTOMY     Family History  Problem Relation Age of Onset  . Heart attack Father    Social History   Tobacco Use  . Smoking status: Former Smoker    Packs/day: 1.00    Years: 17.00    Pack years: 17.00    Types: Cigarettes    Quit date: 07/02/1967    Years since quitting: 52.9  . Smokeless tobacco: Former [employer] developer   Types: Chew    Quit date: 07/01/1969  Substance Use Topics  . Alcohol use: Yes    Alcohol/week: 2.0 standard drinks    Types: 2 Cans of beer per week    Comment: nightly  Married    ROS  Review of Systems  Constitutional: Negative for malaise/fatigue and weight gain.  Cardiovascular: Negative for chest pain, claudication, dyspnea on  exertion, leg swelling, near-syncope, orthopnea, palpitations, paroxysmal nocturnal dyspnea and syncope.  Respiratory: Negative for shortness of breath.   Hematologic/Lymphatic: Does not bruise/bleed easily.  Gastrointestinal: Negative for melena.  Neurological: Negative for dizziness and weakness.   Objective  Blood pressure 128/72, pulse 67, temperature 97.8 F (36.6 C), temperature source Temporal, resp. rate 16, height 6' (1.829 m), weight 202 lb (91.6 kg), SpO2 98 %.  Vitals with BMI 05/19/2020 01/29/2020 12/18/2019  Height _0  _1  -  Weight 202 lbs 203 lbs -  BMI 62.26 33.35 -  Systolic 456 256 389  Diastolic 72 68 84  Pulse 67 53 -    Physical Exam Vitals reviewed.   HENT:     Head: Normocephalic and atraumatic.  Cardiovascular:     Rate and Rhythm: Normal rate and regular rhythm.     Pulses: Intact distal pulses.     Heart sounds: Normal heart sounds, S1 normal and S2 normal. No murmur heard. No gallop.      Comments: No leg edema, no JVD. Pulmonary:     Effort: Pulmonary effort is normal. No respiratory distress.     Breath sounds: Normal breath sounds. No wheezing, rhonchi or rales.  Abdominal:     General: Bowel sounds are normal.     Palpations: Abdomen is soft.  Musculoskeletal:        General: Normal range of motion.     Right lower leg: No edema.     Left lower leg: No edema.  Neurological:     General: No focal deficit present.     Mental Status: He is alert and oriented to person, place, and time.    Laboratory examination:   Recent Labs    05/25/19 0941 11/25/19 2343 05/05/20 0934  NA 138 136 137  K 4.5 4.2 4.8  CL 101 105 102  CO2 _2 GLUCOSE 93 130* 102*  BUN _3 CREATININE 0.99 1.41* 0.90  CALCIUM 9.4 9.1 9.8  GFRNONAA 74 48*  --   GFRAA 85  --   --    estimated creatinine clearance is 75.4 mL/min (by C-G formula based on SCr of 0.9 mg/dL).  CMP Latest Ref Rng & Units 05/05/2020 11/25/2019 05/25/2019  Glucose 65 - 99 mg/dL 102(H) 130(H) 93  BUN 8 - 27 mg/dL _4 Creatinine 0.76 - 1.27 mg/dL 0.90 1.41(H) 0.99  Sodium 134 - 144 mmol/L 137 136 138  Potassium 3.5 - 5.2 mmol/L 4.8 4.2 4.5  Chloride 96 - 106 mmol/L 102 105 101  CO2 20 - 29 mmol/L _5 Calcium 8.6 - 10.2 mg/dL 9.8 9.1 9.4  Total Protein 6.0 - 8.5 g/dL 6.9 6.8 -  Total Bilirubin 0.0 - 1.2 mg/dL 0.5 0.8 -  Alkaline Phos 44 - 121 IU/L 43(L) 37(L) -  AST 0 - 40 IU/L 22 21 -  ALT 0 - 44 IU/L 23 23 -   CBC Latest Ref Rng & Units 11/25/2019 01/26/2019 09/25/2007  WBC 4.0 - 10.5 K/uL 10.7(H) 7.7 -  Hemoglobin 13.0 - 17.0 g/dL 12.9(L) 14.2 12.9(L)  Hematocrit 39.0 - 52.0 % 39.4 43.2 38.0(L)  Platelets 150 - 400 K/uL 250 292 -    Lipid Panel Recent Labs    05/05/20 0934  CHOL 192  TRIG 79  LDLCALC 103*  HDL 75    HEMOGLOBIN A1C No results found for: HGBA1C, MPG TSH No results for input(s): TSH in the last 8760 hours.  External labs:  02/04/2018:  Total cholesterol 187, triglycerides 95, HDL 57, LDL 111.  HB 14.6/HCT 42.6, platelets 280, normal indicis.  Serum glucose 101 mg, BUN 15, creatinine 0.99, eGFR greater than 60 mL, potassium 5.0.  CMP otherwise normal.  Medications and allergies  No Known Allergies   Current Outpatient Medications on File Prior to Visit  Medication Sig Dispense Refill  . aspirin EC 81 MG tablet Take 81 mg by mouth daily. Pt reports taking 1/2 tablet daily.    . flecainide (TAMBOCOR) 50 MG tablet Take 1.5 tablets (75 mg total) by mouth daily. 135 tablet 3  . omeprazole (PRILOSEC) 20 MG capsule Take 20 mg by mouth daily.    . simvastatin (ZOCOR) 40 MG tablet Take 1 tablet (40 mg total) by mouth at bedtime. 90 tablet 4  . valsartan (DIOVAN) 320 MG tablet Take 1 tablet (320 mg total) by mouth daily. (Patient taking differently: Take 320 mg by mouth daily. 1 tablet every other day, and a 1/2 tablet all other days) 90 tablet 1   No current facility-administered medications on file prior to visit.    Radiology:  No results found.  Cardiac Studies:   Stress EKG 01/23/11: Normal stress EKG. ST dep back to baseline with exercise of 2.5 mm back to baseline at 1: 40 minutes into recovery. 7 minutes and 10 METs.   Echocardiogram 01/08/2017: Left ventricle cavity is normal in size. Mild concentric hypertrophy of the left ventricle. Normal global wall motion. Calculated EF 61%. Left atrial cavity is mildly dilated. Mild (Grade I) mitral regurgitation. No significant change compared to prior study in 2012.  Mobile cardiac telemetry 14 days 11/27/2019 - 12/11/2019: Dominant rhythm: Sinus with first degree AV block HR 45-144 bpm. Avg HR 66 bpm. 19 episodes of SVT, fastest at 126  bpm for 8 beats, longest for 12 beats at 102 bpm. <1% isolated SVE, couplet/triplets. <1% isolated VE, couplet/triplets. No atrial fibrillation/atrial flutter/VT/high grade AV block, sinus pause >3sec noted. 1 patient triggered event correlates with sinus rhythm/artifact  EKG:   EKG 05/19/2020: Sinus rhythm with first-degree AV block at a rate of 62 bpm.  Left axis. Incomplete left bundle branch block. Poor R wave progression, cannot exclude anteroseptal infarct old.  Borderline criteria for LVH.    EKG 11/18/2019: Sinus rhythm with first-degree AV block at rate of 61 bpm, left axis deviation, left intrafascicular block.  Poor R wave progression, cannot exclude anteroseptal infarct old.  IVCD, incomplete left bundle branch block.  Borderline criteria for LVH.  No significant change from 02/02/2019.  Assessment     ICD-10-CM   1. Essential hypertension  I10   2. Pure hypercholesterolemia  E78.00   3. Paroxysmal atrial fibrillation (HCC)  I48.0 EKG 12-Lead    Meds ordered this encounter  Medications  . psyllium (METAMUCIL) 0.52 g capsule    Sig: Take 1 capsule (0.52 g total) by mouth daily.    Dispense:  90 capsule    Refill:  3   There are no discontinued medications. This patients CHA2DS2-VASc Score 3 (HTN, Age) and yearly risk of stroke 3.2%.   Recommendations:   JAVARES KAUFHOLD  is a 75 y.o. Caucasian male with  h/o remote A. Fibrillation  In 2013 without recurrence and maintains sinus on Flecainide 50 mg BID chronically. (converted to sinus on Flecainide and metoprolol and discontinued excessive coffee drinking).  Patient has since stopped metoprolol due to bradycardia. Patient had episode of vasovagal syncope 11/25/2019, no recurrence since then.  He has had no known recurrence of A. fib since using flecainide.  Patient presents for 40-monthfollow-up of atrial fibrillation and hypertension.  Patient continues to take valsartan 1 full tablet every other day and 1/2 tablet all other  days.  His blood pressure is well controlled at this time.  Therefore will not make changes to his antihypertensive medications.  Patient's renal function has remained stable.  In regard to hyperlipidemia, patient's LDL is slightly above goal at 103.  Discussed with patient management options including additional medication, strict diet modification, Metamucil.  Shared decision was to proceed with strict diet modification and Metamucil daily.  Repeat lipid profile testing at next visit.  Otherwise no changes made to patient's cardiovascular medications at this time.  Patient continues to maintain sinus rhythm, therefore we will continue flecainide.  And discussed with patient regarding anticoagulation, shared decision was that he will not start anticoagulation as he has had no recurrence of atrial fibrillation.  Patient understands elevated risk of stroke if he were to have recurrence of atrial fibrillation.  Again counseled patient regarding heart healthy low-sodium diet.  Encouraged him to remain active.  Follow-up in 6 months, sooner if needed, for hyperlipidemia and hypertension.   CAlethia Berthold PA-C 05/19/2020, 3:04 PM Office: [phone]

## 2020-05-24 ENCOUNTER — Encounter: Payer: Self-pay | Admitting: Dermatology

## 2020-05-24 NOTE — Progress Notes (Signed)
   Follow-Up Visit   Subjective  [name] is a 75 y.o. male who presents for the following: Follow-up (Follow up on PDT red light. Per patient it worked really well, face peeled almost continuous for about a week. Only painful for 24 hours. ).  Crusts Location: Face and scalp Duration:  Quality:  Associated Signs/Symptoms: Modifying Factors: PDT Severity:  Timing: Context:   Objective  Well appearing patient in no apparent distress; mood and affect are within normal limits. Objective  Left Parotid Area, Left Superior Helix, Mid Forehead, Right Anterior Lobule (2), Right Buccal Cheek (2), Right Mid Helix (3): Dramatic reduction in number of smaller actinic keratoses but still with roughly a dozen pink hornlike 2 to 4 mm papules    A focused examination was performed including Head and neck.. Relevant physical exam findings are noted in the Assessment and Plan.   Assessment & Plan    AK (actinic keratosis) (10) Left Superior Helix; Right Mid Helix (3); Right Anterior Lobule (2); Mid Forehead; Left Parotid Area; Right Buccal Cheek (2)  Destruction of lesion - Left Parotid Area, Left Superior Helix, Mid Forehead, Right Anterior Lobule, Right Buccal Cheek, Right Mid Helix Complexity: simple   Destruction method: cryotherapy   Informed consent: discussed and consent obtained   Timeout:  patient name, date of birth, surgical site, and procedure verified Lesion destroyed using liquid nitrogen: Yes   Cryotherapy cycles:  5 Outcome: patient tolerated procedure well with no complications   Post-procedure details: wound care instructions given        I, Lavonna Monarch, MD, have reviewed all documentation for this visit.  The documentation on 05/24/20 for the exam, diagnosis, procedures, and orders are all accurate and complete.

## 2020-11-17 NOTE — Progress Notes (Signed)
Primary Physician/Referring:  Christain Sacramento, MD  Patient ID: [name], male    DOB: March 11, 1945, 75 y.o.   MRN: [id number]  Chief Complaint  Patient presents with   Atrial Fibrillation   Hypertension   HPI:    [name]  is a 75 y.o. Caucasian male with  h/o remote A. Fibrillation  In 2013 without recurrence and maintains sinus on Flecainide 50 mg BID chronically. (converted to sinus on Flecainide and metoprolol and discontinued excessive coffee drinking).  Patient has since stopped metoprolol due to bradycardia. Patient had episode of vasovagal syncope 11/25/2019, no recurrence since then.  He has had no known recurrence of A. fib since using flecainide.  Patient presents for 77-monthfollow-up.  Last office visit patient's LDL was elevated at 103, patient opted for strict diet modification and daily Metamucil; otherwise no changes were made.  Patient reports he is doing well overall.  Continues to play tennis regularly without issue.  Reports home blood pressure readings averaging 130/75-80 mmHg.  He has cut salt intake significantly.  Denies chest pain, dizziness, palpitations, syncope, dyspnea.  Denies leg edema, orthopnea, PND.   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   [facility]   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 05[place]   left upper arm - MOH[facility]   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 10/[place]   upper mid back - MOH[facility]   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 06/05[facility]    left scapula - MOHs   Squamous cell carcinoma of skin 10/13/2003   right cheek - treated10/07/2003   Squamous cell carcinoma of skin 06/05/2005   left collarbone - MOHs   Squamous cell carcinoma of skin 12/15/2008   top right ear - CX3 + 5FU   Squamous cell carcinoma, face 03/01/2015   right cheek - CX3 + 5FU   Squamous cell carcinoma, face 03/01/2015   right side of nose - MOHs   Past Surgical History:  Procedure Laterality Date   APPENDECTOMY     Family History  Problem Relation Age of Onset   Heart attack Father    Social History   Tobacco Use   Smoking status: Former    Packs/day: 1.00    Years: 17.00    Pack years: 17.00    Types: Cigarettes    Quit date: 07/02/1967    Years since quitting: 53.4   Smokeless tobacco: Former    Types: Chew    Quit date: 07/01/1969  Substance Use Topics   Alcohol use: Yes    Alcohol/week: 2.0 standard drinks    Types: 2 Cans of beer per week    Comment: nightly  Married    ROS  Review of Systems  Cardiovascular:  Negative for chest pain, claudication, dyspnea on exertion, leg swelling, near-syncope, orthopnea, palpitations, paroxysmal nocturnal dyspnea and syncope.  Respiratory:  Negative for shortness of breath.   Hematologic/Lymphatic: Does not bruise/bleed easily.  Gastrointestinal:  Negative  for melena.  Neurological:  Negative for dizziness and weakness.  Objective  Blood pressure (!) 144/78, pulse 64, temperature 97.9 F (36.6 C), height 6' (1.829 m), weight 205 lb (93 kg), SpO2 98 %.  Vitals with BMI 11/18/2020 11/18/2020 05/19/2020  Height - 6' 0"  6' 0"   Weight - 205 lbs 202 lbs  BMI - 49.4 49.67  Systolic 591 638 466  Diastolic 78 79 72  Pulse 64 66 67    Physical Exam Vitals reviewed.  HENT:     Head: Normocephalic and atraumatic.  Cardiovascular:     Rate and Rhythm: Normal rate and regular rhythm.     Pulses: Intact distal pulses.     Heart sounds: Normal heart sounds, S1 normal and S2 normal. No murmur  heard.   No gallop.     Comments: No leg edema, no JVD. Pulmonary:     Effort: Pulmonary effort is normal. No respiratory distress.     Breath sounds: Normal breath sounds. No wheezing, rhonchi or rales.  Musculoskeletal:        General: Normal range of motion.     Right lower leg: No edema.     Left lower leg: No edema.  Neurological:     Mental Status: He is alert.   Laboratory examination:   Recent Labs    11/25/19 2343 05/05/20 0934  NA 136 137  K 4.2 4.8  CL 105 102  CO2 24 22  GLUCOSE 130* 102*  BUN 23 18  CREATININE 1.41* 0.90  CALCIUM 9.1 9.8  GFRNONAA 48*  --    CrCl cannot be calculated (Patient's most recent lab result is older than the maximum 21 days allowed.).  CMP Latest Ref Rng & Units 05/05/2020 11/25/2019 05/25/2019  Glucose 65 - 99 mg/dL 102(H) 130(H) 93  BUN 8 - 27 mg/dL 18 23 14   Creatinine 0.76 - 1.27 mg/dL 0.90 1.41(H) 0.99  Sodium 134 - 144 mmol/L 137 136 138  Potassium 3.5 - 5.2 mmol/L 4.8 4.2 4.5  Chloride 96 - 106 mmol/L 102 105 101  CO2 20 - 29 mmol/L 22 24 22   Calcium 8.6 - 10.2 mg/dL 9.8 9.1 9.4  Total Protein 6.0 - 8.5 g/dL 6.9 6.8 -  Total Bilirubin 0.0 - 1.2 mg/dL 0.5 0.8 -  Alkaline Phos 44 - 121 IU/L 43(L) 37(L) -  AST 0 - 40 IU/L 22 21 -  ALT 0 - 44 IU/L 23 23 -   CBC Latest Ref Rng & Units 11/25/2019 01/26/2019 09/25/2007  WBC 4.0 - 10.5 K/uL 10.7(H) 7.7 -  Hemoglobin 13.0 - 17.0 g/dL 12.9(L) 14.2 12.9(L)  Hematocrit 39.0 - 52.0 % 39.4 43.2 38.0(L)  Platelets 150 - 400 K/uL 250 292 -   Lipid Panel Recent Labs    05/05/20 0934  CHOL 192  TRIG 79  LDLCALC 103*  HDL 75    HEMOGLOBIN A1C No results found for: HGBA1C, MPG TSH No results for input(s): TSH in the last 8760 hours.   External labs:  02/04/2018:  Total cholesterol 187, triglycerides 95, HDL 57, LDL 111.  HB 14.6/HCT 42.6, platelets 280, normal indicis.  Serum glucose 101 mg, BUN 15, creatinine 0.99, eGFR greater than 60 mL, potassium 5.0.  CMP otherwise  normal. Allergies  No Known Allergies    Medications Prior to Visit:   Outpatient Medications Prior to Visit  Medication Sig Dispense Refill   aspirin EC 81 MG tablet Take 81 mg by mouth daily. Pt reports taking 1/2 tablet daily.  flecainide (TAMBOCOR) 50 MG tablet Take 1.5 tablets (75 mg total) by mouth daily. 135 tablet 3   omeprazole (PRILOSEC) 20 MG capsule Take 20 mg by mouth daily.     psyllium (METAMUCIL) 0.52 g capsule Take 1 capsule (0.52 g total) by mouth daily. 90 capsule 3   simvastatin (ZOCOR) 40 MG tablet Take 1 tablet (40 mg total) by mouth at bedtime. 90 tablet 4   valsartan (DIOVAN) 320 MG tablet Take 1 tablet (320 mg total) by mouth daily. (Patient taking differently: Take 320 mg by mouth daily. 1 tablet every other day, and a 1/2 tablet all other days) 90 tablet 1   No facility-administered medications prior to visit.   Final Medications at End of Visit    Current Meds  Medication Sig   aspirin EC 81 MG tablet Take 81 mg by mouth daily. Pt reports taking 1/2 tablet daily.   flecainide (TAMBOCOR) 50 MG tablet Take 1.5 tablets (75 mg total) by mouth daily.   omeprazole (PRILOSEC) 20 MG capsule Take 20 mg by mouth daily.   psyllium (METAMUCIL) 0.52 g capsule Take 1 capsule (0.52 g total) by mouth daily.   simvastatin (ZOCOR) 40 MG tablet Take 1 tablet (40 mg total) by mouth at bedtime.   valsartan (DIOVAN) 320 MG tablet Take 1 tablet (320 mg total) by mouth daily. (Patient taking differently: Take 320 mg by mouth daily. 1 tablet every other day, and a 1/2 tablet all other days)    Radiology:  No results found.  Cardiac Studies:   Stress EKG 01/23/11: Normal stress EKG. ST dep back to baseline with exercise of 2.5 mm back to baseline at 1: 40 minutes into recovery. 7 minutes and 10 METs.   Echocardiogram 01/08/2017: Left ventricle cavity is normal in size. Mild concentric hypertrophy of the left ventricle. Normal global wall motion. Calculated EF 61%. Left  atrial cavity is mildly dilated. Mild (Grade I) mitral regurgitation. No significant change compared to prior study in 2012.  Mobile cardiac telemetry 14 days 11/27/2019 - 12/11/2019: Dominant rhythm: Sinus with first degree AV block HR 45-144 bpm. Avg HR 66 bpm. 19 episodes of SVT, fastest at 126 bpm for 8 beats, longest for 12 beats at 102 bpm. <1% isolated SVE, couplet/triplets. <1% isolated VE, couplet/triplets. No atrial fibrillation/atrial flutter/VT/high grade AV block, sinus pause >3sec noted. 1 patient triggered event correlates with sinus rhythm/artifact  EKG:  11/18/2020: Sinus rhythm with first-degree AV block at a rate of 62 bpm.  Left axis.  LVH.  IVCD.  05/19/2020: Sinus rhythm with first-degree AV block at a rate of 62 bpm.  Left axis. Incomplete left bundle branch block. Poor R wave progression, cannot exclude anteroseptal infarct old.  Borderline criteria for LVH.    EKG 11/18/2019: Sinus rhythm with first-degree AV block at rate of 61 bpm, left axis deviation, left intrafascicular block.  Poor R wave progression, cannot exclude anteroseptal infarct old.  IVCD, incomplete left bundle branch block.  Borderline criteria for LVH.  No significant change from 02/02/2019.  Assessment     ICD-10-CM   1. Essential hypertension  I10 EKG 12-Lead    2. Pure hypercholesterolemia  E78.00 Lipid Panel With LDL/HDL Ratio    3. Paroxysmal atrial fibrillation (HCC)  I48.0 EKG 12-Lead      No orders of the defined types were placed in this encounter.  There are no discontinued medications. This patients CHA2DS2-VASc Score 3 (HTN, Age) and yearly risk of stroke 3.2%.   Recommendations:  NIMAI BURBACH  is a 75 y.o. Caucasian male with  h/o remote A. Fibrillation  In 2013 without recurrence and maintains sinus on Flecainide 50 mg BID chronically. (converted to sinus on Flecainide and metoprolol and discontinued excessive coffee drinking).  Patient has since stopped metoprolol due to  bradycardia. Patient had episode of vasovagal syncope 11/25/2019, no recurrence since then.  He has had no known recurrence of A. fib since using flecainide.  Patient presents for 54-monthfollow-up.  Last office visit patient's LDL was elevated at 103, patient opted for strict diet modification and daily Metamucil; otherwise no changes were made.  Patient's blood pressure is mildly elevated in the office today, however it is well controlled on home monitoring.  Will not make changes to antihypertensive medications at this time.  We will repeat lipid profile testing to reevaluate LDL control consider lipid management medications if necessary.  Discussed with patient the importance of continued physical activity and diet modifications as well as weight loss.  Patient continues to maintain sinus rhythm, therefore we will continue flecainide.  And discussed with patient regarding anticoagulation, shared decision was that he will not start anticoagulation as he has had no recurrence of atrial fibrillation.  Patient understands elevated risk of stroke if he were to have recurrence of atrial fibrillation.  Follow up in 1 year, sooner if needed, for a fib, hypertension, hyperlipidemia.    CAlethia Berthold PA-C 11/18/2020, 10:06 AM Office: [phone]

## 2020-11-18 ENCOUNTER — Other Ambulatory Visit: Payer: Self-pay

## 2020-11-18 ENCOUNTER — Ambulatory Visit: Payer: Medicare PPO | Admitting: Student

## 2020-11-18 ENCOUNTER — Encounter: Payer: Self-pay | Admitting: Student

## 2020-11-18 VITALS — BP 144/78 | HR 64 | Temp 97.9°F | Ht 72.0 in | Wt 205.0 lb

## 2020-11-18 DIAGNOSIS — I10 Essential (primary) hypertension: Secondary | ICD-10-CM

## 2020-11-18 DIAGNOSIS — I480 Paroxysmal atrial fibrillation: Secondary | ICD-10-CM

## 2020-11-18 DIAGNOSIS — E7800 Pure hypercholesterolemia, unspecified: Secondary | ICD-10-CM

## 2020-11-25 LAB — LIPID PANEL WITH LDL/HDL RATIO
Cholesterol, Total: 166 mg/dL (ref 100–199)
HDL: 62 mg/dL (ref >39)
LDL Chol Calc (NIH): 92 mg/dL (ref 0–99)
LDL/HDL Ratio: 1.5 ratio (ref 0.0–3.6)
Triglycerides: 60 mg/dL (ref 0–149)
VLDL Cholesterol Cal: 12 mg/dL (ref 5–40)

## 2020-12-30 ENCOUNTER — Other Ambulatory Visit: Payer: Self-pay | Admitting: Cardiology

## 2021-04-07 ENCOUNTER — Other Ambulatory Visit: Payer: Self-pay

## 2021-04-07 DIAGNOSIS — I10 Essential (primary) hypertension: Secondary | ICD-10-CM

## 2021-04-07 MED ORDER — VALSARTAN 320 MG PO TABS: 320.0000 mg | ORAL_TABLET | Freq: Every day | ORAL | 1 refills | Status: DC

## 2021-04-07 MED ORDER — FLECAINIDE ACETATE 50 MG PO TABS: 75.0000 mg | ORAL_TABLET | Freq: Every day | ORAL | 3 refills | Status: DC

## 2021-05-15 ENCOUNTER — Encounter: Payer: Self-pay | Admitting: Dermatology

## 2021-05-15 ENCOUNTER — Ambulatory Visit: Payer: Medicare PPO | Admitting: Dermatology

## 2021-05-15 DIAGNOSIS — D485 Neoplasm of uncertain behavior of skin: Secondary | ICD-10-CM

## 2021-05-15 DIAGNOSIS — Z1283 Encounter for screening for malignant neoplasm of skin: Secondary | ICD-10-CM

## 2021-05-15 DIAGNOSIS — C44629 Squamous cell carcinoma of skin of left upper limb, including shoulder: Secondary | ICD-10-CM | POA: Diagnosis not present

## 2021-05-15 DIAGNOSIS — L570 Actinic keratosis: Secondary | ICD-10-CM | POA: Diagnosis not present

## 2021-05-15 NOTE — Patient Instructions (Signed)

## 2021-06-03 ENCOUNTER — Encounter: Payer: Self-pay | Admitting: Dermatology

## 2021-06-03 NOTE — Progress Notes (Signed)
? ?  Follow-Up Visit ?  ?Subjective  ?[name] is a 76 y.o. male who presents for the following: Annual Exam (No new concerns). ? ?Skin examination, some crusts on face and left hand ?Location:  ?Duration:  ?Quality:  ?Associated Signs/Symptoms: ?Modifying Factors:  ?Severity:  ?Timing: ?Context:  ? ?Objective  ?Well appearing patient in no apparent distress; mood and affect are within normal limits. ?Mid Forehead ?Multiple small actinic keratoses ? ?Left Dorsal Hand ?1.2 cm multifocal waxy crust ? ? ? ? ? ? ? ? ?All skin waist up examined. ? ? ?Assessment & Plan  ? ? ?AK (actinic keratosis) ?Mid Forehead ? ?PDT vs Tolak cream this winter ? ?Squamous cell carcinoma of left hand ?Left Dorsal Hand ? ?Skin / nail biopsy ?Type of biopsy: tangential   ?Informed consent: discussed and consent obtained   ?Timeout: patient name, date of birth, surgical site, and procedure verified   ?Procedure prep:  Patient was prepped and draped in usual sterile fashion (Non sterile) ?Prep type:  Chlorhexidine ?Anesthesia: the lesion was anesthetized in a standard fashion   ?Anesthetic:  1% lidocaine w/ epinephrine 1-100,000 local infiltration ?Instrument used: flexible razor blade   ?Outcome: patient tolerated procedure well   ?Post-procedure details: wound care instructions given   ? ?Destruction of lesion ?Complexity: simple   ?Destruction method: electrodesiccation and curettage   ?Informed consent: discussed and consent obtained   ?Timeout:  patient name, date of birth, surgical site, and procedure verified ?Anesthesia: the lesion was anesthetized in a standard fashion   ?Anesthetic:  1% lidocaine w/ epinephrine 1-100,000 local infiltration ?Curettage performed in three different directions: Yes   ?Curettage cycles:  3 ?Lesion length (cm):  1.2 ?Lesion width (cm):  1.2 ?Margin per side (cm):  0 ?Final wound size (cm):  1.2 ?Hemostasis achieved with:  aluminum chloride ?Outcome: patient tolerated procedure well with no  complications   ?Post-procedure details: wound care instructions given   ? ?Specimen 1 - Surgical pathology ?Differential Diagnosis: bcc vs [id number] ? ?Check Margins: No ? ?Shave biopsy the base was treated with curettage plus cautery ? ? ? ? ? ?I, Lavonna Monarch, MD, have reviewed all documentation for this visit.  The documentation on 06/03/21 for the exam, diagnosis, procedures, and orders are all accurate and complete. ?

## 2021-10-03 ENCOUNTER — Other Ambulatory Visit: Payer: Self-pay

## 2021-10-03 DIAGNOSIS — I10 Essential (primary) hypertension: Secondary | ICD-10-CM

## 2021-10-03 MED ORDER — VALSARTAN 320 MG PO TABS: 320.0000 mg | ORAL_TABLET | Freq: Every day | ORAL | 1 refills | Status: DC

## 2021-11-16 ENCOUNTER — Ambulatory Visit: Payer: Medicare PPO | Admitting: Cardiology

## 2021-11-16 ENCOUNTER — Encounter: Payer: Self-pay | Admitting: Cardiology

## 2021-11-16 ENCOUNTER — Telehealth: Payer: Self-pay

## 2021-11-16 VITALS — BP 140/76 | HR 62 | Temp 98.0°F | Resp 16 | Ht 72.0 in | Wt 214.4 lb

## 2021-11-16 DIAGNOSIS — I480 Paroxysmal atrial fibrillation: Secondary | ICD-10-CM

## 2021-11-16 DIAGNOSIS — I10 Essential (primary) hypertension: Secondary | ICD-10-CM

## 2021-11-16 DIAGNOSIS — E7800 Pure hypercholesterolemia, unspecified: Secondary | ICD-10-CM

## 2021-11-16 MED ORDER — AMLODIPINE BESYLATE 5 MG PO TABS: 5.0000 mg | ORAL_TABLET | ORAL | 3 refills | Status: DC

## 2021-11-16 MED ORDER — VALSARTAN 320 MG PO TABS: 320.0000 mg | ORAL_TABLET | Freq: Every evening | ORAL | 3 refills | Status: DC

## 2021-11-16 NOTE — Telephone Encounter (Signed)
So does he need a new prescription for Simvastatin 20 mg? Pharmacist states he only has Rx for '40mg'$ 

## 2021-11-16 NOTE — Telephone Encounter (Signed)
Dr. Einar Gip saw him this morning and said that he was on simvastatin '20mg'$ - let me double check with him and I will let you know.

## 2021-11-16 NOTE — Progress Notes (Signed)
Primary Physician/Referring:  Christain Sacramento, MD  Patient ID: [name], male    DOB: 1945-06-04, 76 y.o.   MRN: [id number]  Chief Complaint  Patient presents with   Atrial Fibrillation   Hypertension   Hyperlipidemia   Follow-up    1 year   HPI:    [name]  is a 76 y.o. Caucasian male with  h/o remote A. Fibrillation  In 2013 without recurrence and maintains sinus on Flecainide 50 mg BID chronically. He has had no known recurrence of A. fib since using flecainide.  He is presently asymptomatic. Denies chest pain, dizziness, palpitations, syncope, dyspnea.  Denies leg edema, orthopnea, PND.   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, 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   left upper arm - MOHs   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   upper mid back - MOHs   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[place]) 06/05/2005   left scapula - MOHs   Squamous cell carcinoma of skin 10/13/2003   right cheek - treated10/07/2003   Squamous cell carcinoma of skin 06/05/2005   left collarbone - MOHs   Squamous cell carcinoma of skin 12/15/2008   top right ear - CX3 + 5FU   Squamous cell carcinoma, face 03/01/2015   right cheek - CX3 + 5FU   Squamous cell carcinoma, face 03/01/2015   right side of nose - MOHs   Past Surgical History:  Procedure Laterality Date   APPENDECTOMY     Family History  Problem Relation Age of Onset   Heart attack Father     Social History   Tobacco Use   Smoking status: Former    Packs/day: 1.00    Years: 17.00    Total pack years: 17.00    Types: Cigarettes    Quit date: 07/02/1967    Years since quitting: 54.4   Smokeless tobacco: Former    Types: Chew    Quit date: 07/01/1969  Substance Use Topics   Alcohol use: Yes    Alcohol/week: 2.0 standard drinks of alcohol    Types: 2 Cans of beer per week    Comment: nightly  Married    ROS  Review of Systems  Cardiovascular:  Negative for chest pain, claudication, dyspnea on exertion, leg swelling, near-syncope, orthopnea, palpitations, paroxysmal nocturnal dyspnea and syncope.  Respiratory:  Negative for shortness of breath.   Hematologic/Lymphatic: Does not bruise/bleed easily.  Gastrointestinal:  Negative for melena.  Neurological:  Negative for dizziness and weakness.   Objective  Blood pressure (!) 140/76, pulse 62, temperature 98 F (36.7 C), temperature source Temporal, resp. rate 16, height 6' (1.829 m), weight 214 lb 6.4 oz (97.3 kg), SpO2 99 %.     11/16/2021   10:03 AM 11/16/2021    9:24 AM 11/18/2020    9:04 AM  Vitals with BMI  Height  _0   Weight  214 lbs 6 oz   BMI  94.49   Systolic 675 916 384  Diastolic 76 79 78  Pulse  62 64    Physical Exam Neck:     Vascular: No carotid bruit or JVD.  Cardiovascular:     Rate and Rhythm: Normal rate and regular rhythm.     Pulses: Intact distal pulses.     Heart sounds: Normal heart sounds. No murmur heard.    No gallop.  Pulmonary:     Effort: Pulmonary effort is normal.     Breath sounds: Normal breath sounds.  Abdominal:     General: Bowel sounds are normal.     Palpations: Abdomen is soft.     Hernia: A hernia is present. Hernia is present in the ventral area.  Musculoskeletal:     Right lower leg: No edema.     Left lower leg: No edema.    Laboratory examination:   No results for input(s): "NA", "K", "CL", "CO2", "GLUCOSE", "BUN", "CREATININE", "CALCIUM",  "GFRNONAA", "GFRAA" in the last 8760 hours.  CrCl cannot be calculated (Patient's most recent lab result is older than the maximum 21 days allowed.).     Latest Ref Rng & Units 05/05/2020    9:34 AM 11/25/2019   11:43 PM 05/25/2019    9:41 AM  CMP  Glucose 65 - 99 mg/dL 102  130  93   BUN 8 - 27 mg/dL _0 Creatinine 0.76 - 1.27 mg/dL 0.90  1.41  0.99   Sodium 134 - 144 mmol/L 137  136  138   Potassium 3.5 - 5.2 mmol/L 4.8  4.2  4.5   Chloride 96 - 106 mmol/L 102  105  101   CO2 20 - 29 mmol/L _1 Calcium 8.6 - 10.2 mg/dL 9.8  9.1  9.4   Total Protein 6.0 - 8.5 g/dL 6.9  6.8    Total Bilirubin 0.0 - 1.2 mg/dL 0.5  0.8    Alkaline Phos 44 - 121 IU/L 43  37    AST 0 - 40 IU/L 22  21    ALT 0 - 44 IU/L 23  23        Latest Ref Rng & Units 11/25/2019   11:43 PM 01/26/2019   11:19 AM 09/25/2007   10:55 AM  CBC  WBC 4.0 - 10.5 K/uL 10.7  7.7    Hemoglobin 13.0 - 17.0 g/dL 12.9  14.2  12.9   Hematocrit 39.0 - 52.0 % 39.4  43.2  38.0   Platelets 150 - 400 K/uL 250  292     Lipid Panel Recent Labs    11/24/20 1039  CHOL 166  TRIG 60  LDLCALC 92  HDL 62    No results found for: "TSH"   Allergies  No Known Allergies   Final Medications at End of Visit     Current Outpatient Medications:    aspirin EC 81 MG tablet, Take 81 mg by mouth 3 (three) times a week. Mondays, Wednesdays and Fridays, Disp: , Rfl:    flecainide (TAMBOCOR) 50 MG tablet, Take 1.5 tablets (75 mg total) by mouth daily., Disp: 135 tablet, Rfl: 3   omeprazole (PRILOSEC) 20 MG capsule, Take 20 mg by mouth daily., Disp: , Rfl:    simvastatin (ZOCOR) 40 MG tablet, TAKE ONE TABLET BY MOUTH AT BEDTIME, Disp: 90 tablet, Rfl: 3   amLODipine (NORVASC) 5 MG tablet,  Take 1 tablet (5 mg total) by mouth every morning., Disp: 90 tablet, Rfl: 3   valsartan (DIOVAN) 320 MG tablet, Take 1 tablet (320 mg total) by mouth every evening., Disp: 90 tablet, Rfl: 3    Radiology:  No results found.  Cardiac  Studies:   Stress EKG 01/23/11: Normal stress EKG. ST dep back to baseline with exercise of 2.5 mm back to baseline at 1: 40 minutes into recovery. 7 minutes and 10 METs.   Echocardiogram 01/08/2017: Left ventricle cavity is normal in size. Mild concentric hypertrophy of the left ventricle. Normal global wall motion. Calculated EF 61%. Left atrial cavity is mildly dilated. Mild (Grade I) mitral regurgitation. No significant change compared to prior study in 2012.  Mobile cardiac telemetry 14 days 11/27/2019 - 12/11/2019: Dominant rhythm: Sinus with first degree AV block HR 45-144 bpm. Avg HR 66 bpm. 19 episodes of SVT, fastest at 126 bpm for 8 beats, longest for 12 beats at 102 bpm. <1% isolated SVE, couplet/triplets. <1% isolated VE, couplet/triplets. No atrial fibrillation/atrial flutter/VT/high grade AV block, sinus pause >3sec noted. 1 patient triggered event correlates with sinus rhythm/artifact  EKG:   EKG 11/16/2021: Uterotomy sinus rhythm with first-degree block at rate of 62 bpm, left axis deviation. IVCD, incomplete left bundle branch block.  Assessment     ICD-10-CM   1. Paroxysmal atrial fibrillation (HCC)  I48.0 EKG 12-Lead    2. Essential hypertension  I10 CBC    CMP14+EGFR    TSH    valsartan (DIOVAN) 320 MG tablet    amLODipine (NORVASC) 5 MG tablet    DISCONTINUED: amLODipine (NORVASC) 5 MG tablet    3. Pure hypercholesterolemia  E78.00 Lipid Panel With LDL/HDL Ratio      Meds ordered this encounter  Medications   DISCONTD: amLODipine (NORVASC) 5 MG tablet    Sig: Take 1 tablet (5 mg total) by mouth every morning.    Dispense:  90 tablet    Refill:  3   valsartan (DIOVAN) 320 MG tablet    Sig: Take 1 tablet (320 mg total) by mouth every evening.    Dispense:  90 tablet    Refill:  3   amLODipine (NORVASC) 5 MG tablet    Sig: Take 1 tablet (5 mg total) by mouth every morning.    Dispense:  90 tablet    Refill:  3   Medications Discontinued During  This Encounter  Medication Reason   psyllium (METAMUCIL) 0.52 g capsule    valsartan (DIOVAN) 320 MG tablet Reorder   amLODipine (NORVASC) 5 MG tablet    This patients CHA2DS2-VASc Score 3 (HTN, Age) and yearly risk of stroke 3.2%.   Recommendations:   [name]  is a 76 y.o. Caucasian male with  h/o remote A. Fibrillation  In 2013 without recurrence and maintains sinus on Flecainide 50 mg BID chronically. He has had no known recurrence of A. fib since using flecainide.  He is presently asymptomatic.  His blood pressure is elevated even at home, will add amlodipine 5 mg in the morning.  He is on a low-dose of simvastatin for hyperlipidemia and his lipids previously were well controlled <100.    He has not had any recent labs, I will obtain CMP, CBC, lipids and also TSH.  With regard to atrial fibrillation, EKG revealed sinus bradycardia and is maintaining sinus rhythm.  Physical examination is unchanged, his ventral hernia is enlarging, advised him to use abdominal belt.  I would like to  see him back in 4 to 6 weeks for follow-up of hypertension and follow-up of labs and if he remains stable, I will see him back on an annual basis.    Adrian Prows, MD, [facility] 11/16/2021, 10:17 AM Office: [phone] Fax: [phone] Pager: [phone]

## 2021-11-16 NOTE — Telephone Encounter (Signed)
Brett from pharmacy called and states that the patient is taking Amlodipine 5 mg, along with simvastatin '40mg'$ , He wanted to know if that was purposely done  because that would cause an interaction between the two medications  Please Advise

## 2021-11-17 ENCOUNTER — Ambulatory Visit: Payer: Medicare PPO | Admitting: Student

## 2021-11-29 ENCOUNTER — Telehealth: Payer: Self-pay

## 2021-11-29 NOTE — Telephone Encounter (Signed)
I am aware of the side effects. Please have pharmacy to fill

## 2021-11-29 NOTE — Telephone Encounter (Signed)
Patient called and states that when he was here at his last OV on 10/5 he states that he was already taking Valsartan '320mg'$ ,, and Simvastatin '40mg'$ , he states that he was also prescribed a new medication Amlodipine '5mg'$   The pharmacist told the patient that he would not fill the Amlodipine due to the possible interaction with the simvastatin . Patient states that he would like to know if he should be taking these two medications together. Patient also states that his BP has been running very high while taking the Valsartan '320mg'$ .   Please Advise

## 2021-11-30 NOTE — Telephone Encounter (Signed)
Please ask patient if I can change his cholesterol medication to Lipitor 20 mg or Crestor 10 mg daily

## 2021-11-30 NOTE — Telephone Encounter (Signed)
Spoke with pharmacy and was told by pharmacist that the Simvastatin '40mg'$  that patient is taking needs to be lowered to 20 mg , She states that they can not fill unless dosage of Simvastin is '20mg'$  or lower.  Please advise

## 2021-12-16 IMAGING — CT CT HEAD W/O CM
4 series · 16 of 47 positions shown, 18 images · non-contrast
Comparison: Brain MRI 08/23/2010

CLINICAL DATA: Head trauma

EXAM:
CT HEAD WITHOUT CONTRAST
TECHNIQUE: Contiguous axial images were obtained from the base of the skull
through the vertex without intravenous contrast.

[Series 3: head wo · axial · 0.43mm/px · z∈[+1216,+1341]mm · 7 of 35 slices shown, 9 images]
[im 5/35  brain]
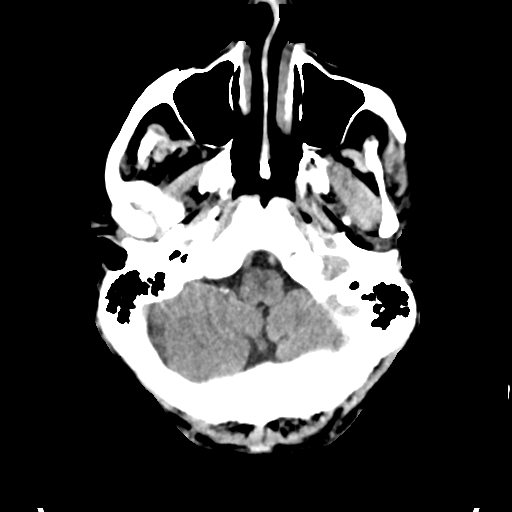
[im 5/35  bone]
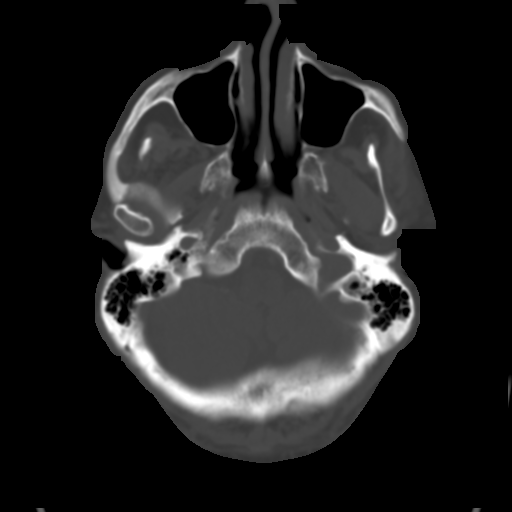
[im 9/35  brain]
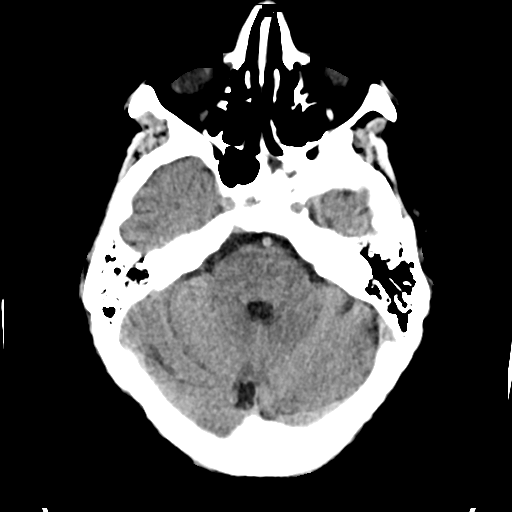
[im 13/35  brain]
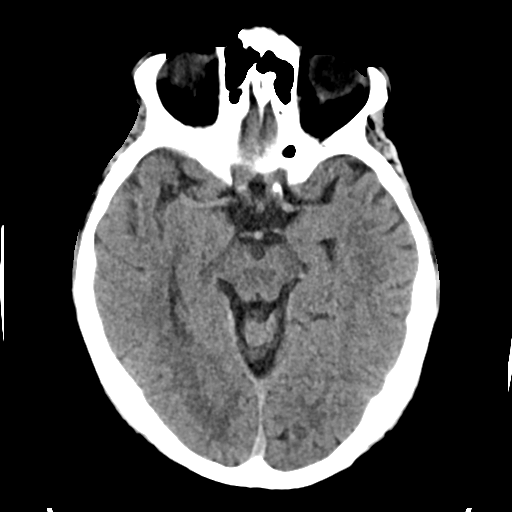
[im 18/35  brain]
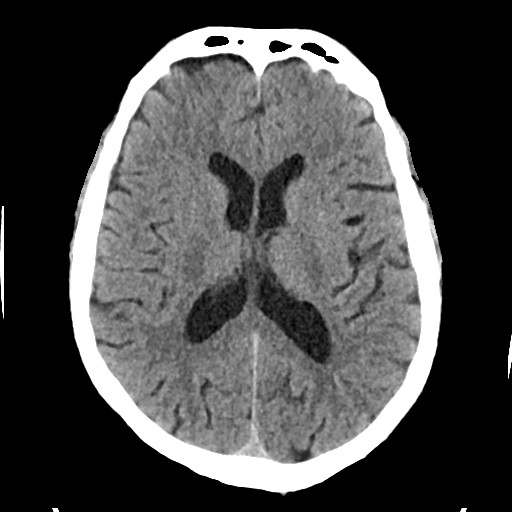
[im 22/35  brain]
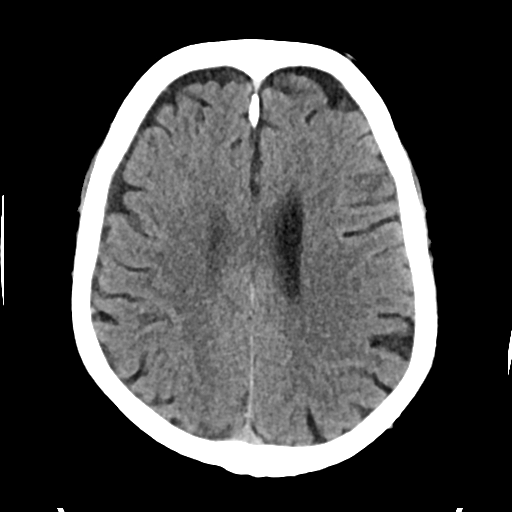
[im 22/35  bone]
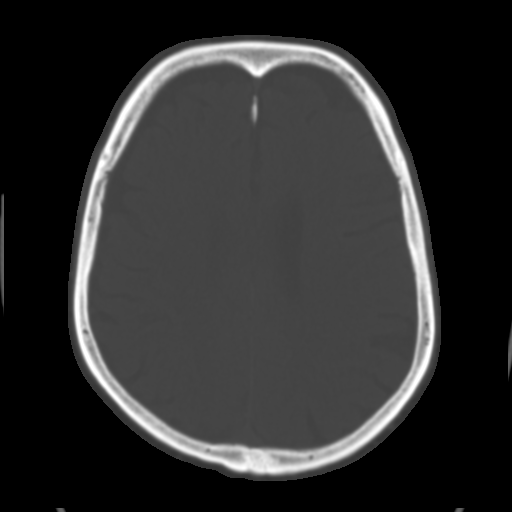
[im 26/35  brain]
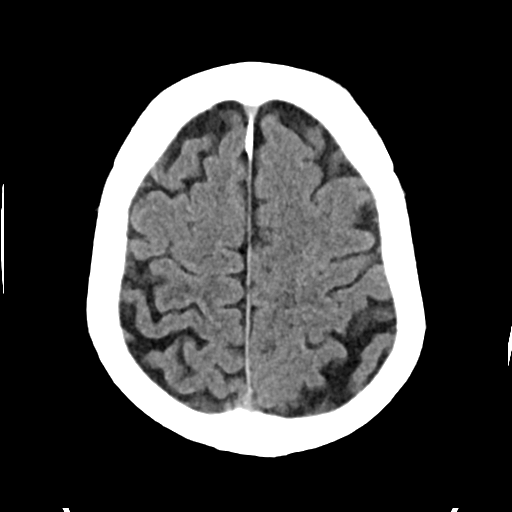
[im 30/35  brain]
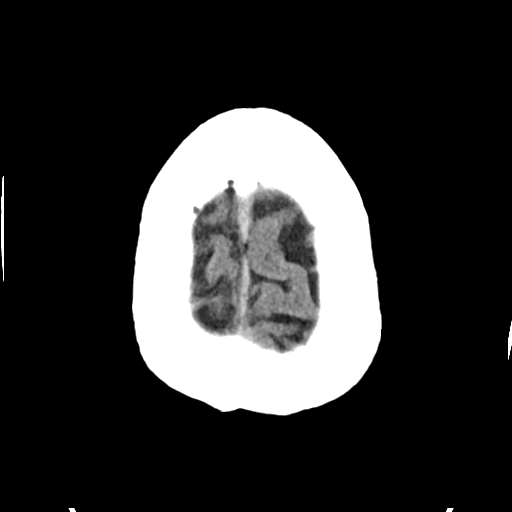

[Series 4: head bone · axial · 0.43mm/px · z∈[+1212,+1248]mm · 3 of 88 slices shown]
[im 9/88  bone]
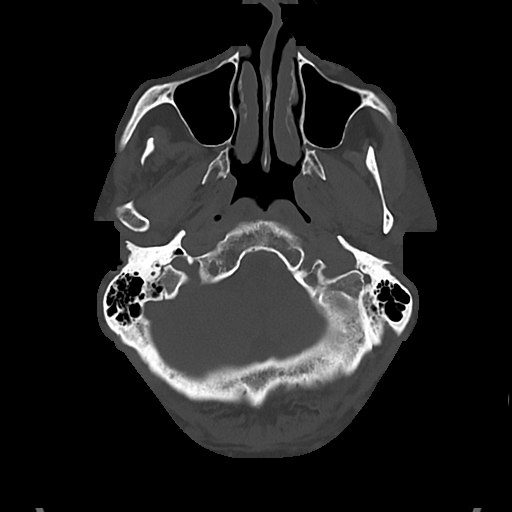
[im 18/88  bone]
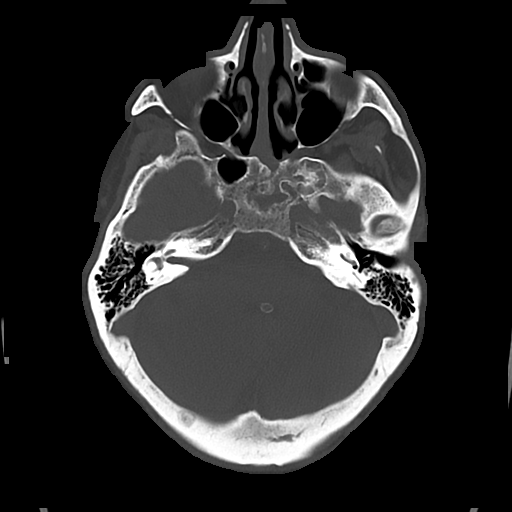
[im 27/88  bone]
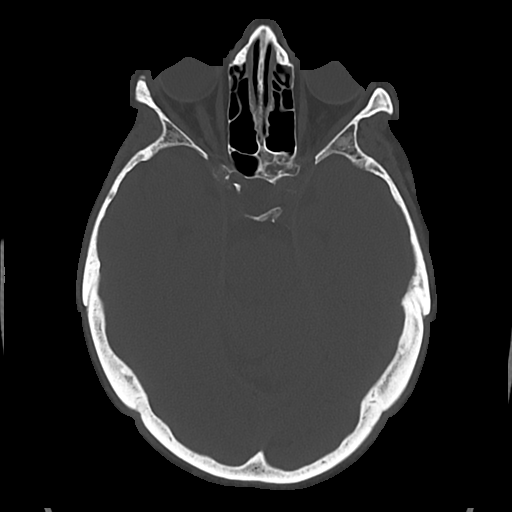

[Series 5: cor soft · coronal · 0.35mm/px · 3 of 73 slices shown]
[im 25/73  brain]
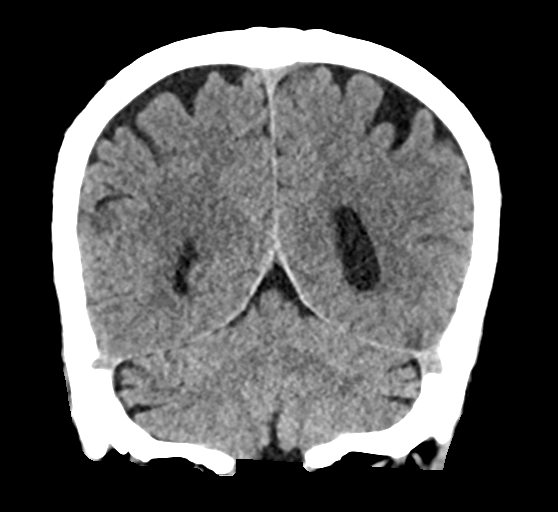
[im 33/73  brain]
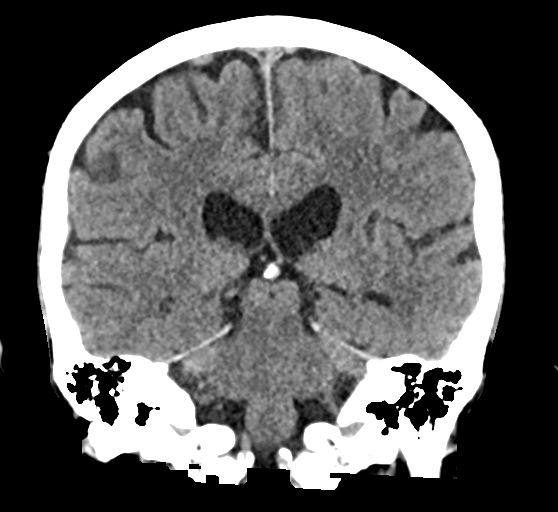
[im 41/73  brain]
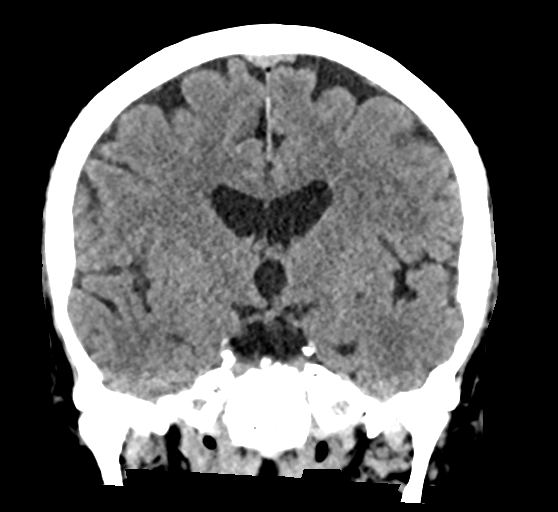

[Series 6: sag soft · sagittal · 0.35mm/px · 3 of 65 slices shown]
[im 22/65  brain]
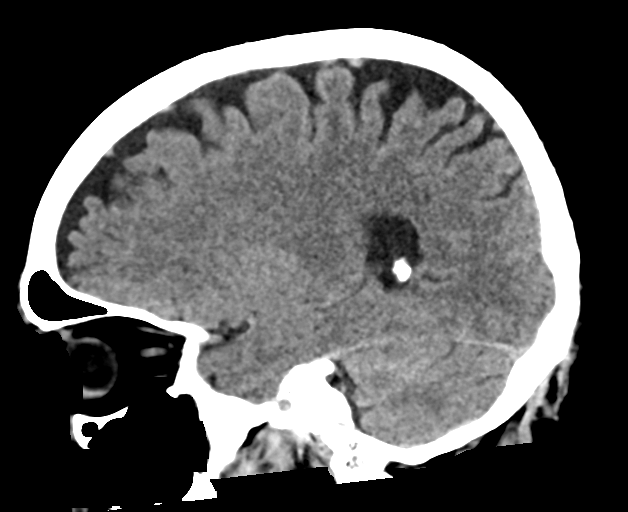
[im 33/65  brain]
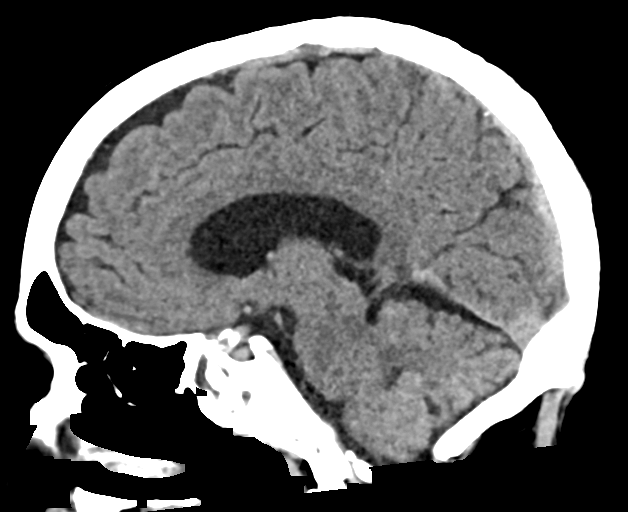
[im 43/65  brain]
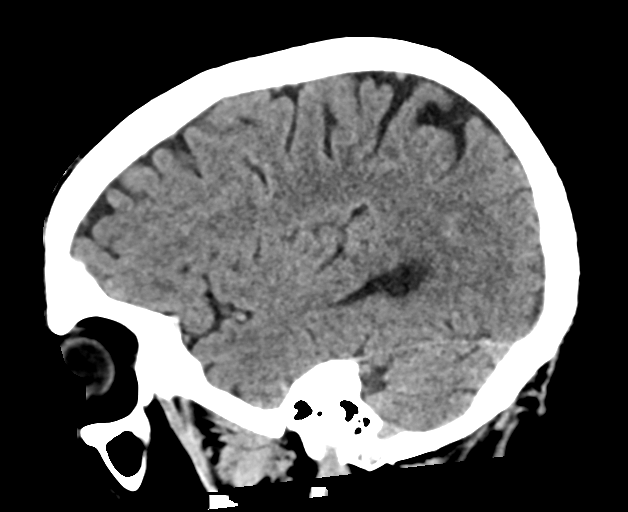

[16 of 47 positions shown; findings below may reference images not displayed]

FINDINGS: Brain: There is no mass, hemorrhage or extra-axial collection. The
size and configuration of the ventricles and extra-axial CSF spaces
are normal. The brain parenchyma is normal, without acute or chronic
infarction. Unchanged appearance of low attenuation mass within the
right sella turcica.

Vascular: No abnormal hyperdensity of the major intracranial
arteries or dural venous sinuses. No intracranial atherosclerosis.

Skull: The visualized skull base, calvarium and extracranial soft
tissues are normal.

Sinuses/Orbits: Left sphenoid sinus opacification with chronic
osseous remodeling. The orbits are normal.
IMPRESSION: 1. No acute intracranial abnormality.
2. Unchanged appearance of low attenuation mass within the right
sella turcica.

## 2021-12-26 LAB — CMP14+EGFR
ALT: 22 IU/L (ref 0–44)
AST: 18 IU/L (ref 0–40)
Albumin/Globulin Ratio: 1.8 (ref 1.2–2.2)
Albumin: 4.3 g/dL (ref 3.8–4.8)
Alkaline Phosphatase: 48 IU/L (ref 44–121)
BUN/Creatinine Ratio: 14 (ref 10–24)
BUN: 13 mg/dL (ref 8–27)
Bilirubin Total: 0.3 mg/dL (ref 0.0–1.2)
CO2: 23 mmol/L (ref 20–29)
Calcium: 9.7 mg/dL (ref 8.6–10.2)
Chloride: 104 mmol/L (ref 96–106)
Creatinine, Ser: 0.91 mg/dL (ref 0.76–1.27)
Globulin, Total: 2.4 g/dL (ref 1.5–4.5)
Glucose: 91 mg/dL (ref 70–99)
Potassium: 4.8 mmol/L (ref 3.5–5.2)
Sodium: 140 mmol/L (ref 134–144)
Total Protein: 6.7 g/dL (ref 6.0–8.5)
eGFR: 86 mL/min/{1.73_m2} (ref >59)

## 2021-12-26 LAB — LIPID PANEL WITH LDL/HDL RATIO
Cholesterol, Total: 258 mg/dL — ABNORMAL HIGH (ref 100–199)
HDL: 54 mg/dL (ref >39)
LDL Chol Calc (NIH): 180 mg/dL — ABNORMAL HIGH (ref 0–99)
LDL/HDL Ratio: 3.3 ratio (ref 0.0–3.6)
Triglycerides: 133 mg/dL (ref 0–149)
VLDL Cholesterol Cal: 24 mg/dL (ref 5–40)

## 2021-12-26 LAB — CBC
Hematocrit: 41.8 % (ref 37.5–51.0)
Hemoglobin: 14.5 g/dL (ref 13.0–17.7)
MCH: 30.6 pg (ref 26.6–33.0)
MCHC: 34.7 g/dL (ref 31.5–35.7)
MCV: 88 fL (ref 79–97)
Platelets: 304 10*3/uL (ref 150–450)
RBC: 4.74 x10E6/uL (ref 4.14–5.80)
RDW: 12.2 % (ref 11.6–15.4)
WBC: 7.8 10*3/uL (ref 3.4–10.8)

## 2021-12-26 LAB — TSH: TSH: 1.54 u[IU]/mL (ref 0.450–4.500)

## 2021-12-26 NOTE — Progress Notes (Signed)
You are seeing him on 16th

## 2021-12-28 ENCOUNTER — Ambulatory Visit: Payer: Medicare PPO

## 2021-12-28 VITALS — BP 126/74 | HR 67 | Resp 16 | Ht 72.0 in | Wt 210.0 lb

## 2021-12-28 DIAGNOSIS — I10 Essential (primary) hypertension: Secondary | ICD-10-CM

## 2021-12-28 DIAGNOSIS — I480 Paroxysmal atrial fibrillation: Secondary | ICD-10-CM

## 2021-12-28 DIAGNOSIS — E7800 Pure hypercholesterolemia, unspecified: Secondary | ICD-10-CM

## 2021-12-28 MED ORDER — ROSUVASTATIN CALCIUM 20 MG PO TABS: 20.0000 mg | ORAL_TABLET | Freq: Every day | ORAL | 3 refills | Status: DC

## 2021-12-28 NOTE — Progress Notes (Signed)
Primary Physician/Referring:  Christain Sacramento, MD  Patient ID: [name], male    DOB: [birth year], 76 y.o.   MRN: [id number]  Chief Complaint  Patient presents with   Atrial Fibrillation   Hypertension   Hyperlipidemia   Follow-up    6 week   HPI:    [name]  is a 76 y.o. Caucasian male with  h/o remote A. Fibrillation  In 2013 without recurrence and maintains sinus on Flecainide 50 mg BID chronically. He has had no known recurrence of A. fib since using flecainide.  He presents today for 6-week follow-up on blood pressure management.  At last visit his blood pressure was elevated he was started on amlodipine 5 mg daily.  He is presently asymptomatic. Denies chest pain, dizziness, palpitations, syncope, dyspnea.  Denies leg edema, orthopnea, PND.   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, 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   left upper arm - MOHs   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   upper mid back - MOHs   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[place]) 06/05/2005   left scapula - MOHs   Squamous cell carcinoma of skin 10/13/2003   right cheek - treated10/07/2003   Squamous cell carcinoma of skin 06/05/2005   left collarbone - MOHs   Squamous cell carcinoma of skin 12/15/2008   top right ear - CX3 + 5FU   Squamous cell carcinoma, face 03/01/2015   right cheek - CX3 + 5FU   Squamous cell carcinoma, face 03/01/2015   right side of nose -  MOHs   Past Surgical History:  Procedure Laterality Date   APPENDECTOMY     Family History  Problem Relation Age of Onset   Heart attack Father    Social History   Tobacco Use   Smoking status: Former    Packs/day: 1.00    Years: 17.00    Total pack years: 17.00    Types: Cigarettes    Quit date: 07/02/1967    Years since quitting: 54.5   Smokeless tobacco: Former    Types: Chew    Quit date: 07/01/1969  Substance Use Topics   Alcohol use: Yes    Alcohol/week: 2.0 standard drinks of alcohol    Types: 2 Cans of beer per week    Comment: nightly  Married    ROS  Review of Systems  Cardiovascular:  Negative for chest pain, claudication, dyspnea on exertion, leg swelling, near-syncope, orthopnea, palpitations, paroxysmal nocturnal dyspnea and syncope.  Respiratory:  Negative for shortness of breath.   Hematologic/Lymphatic: Does not bruise/bleed easily.  Gastrointestinal:  Negative for melena.  Neurological:  Negative for dizziness and weakness.   Objective  Blood pressure 126/74, pulse 67, resp. rate 16, height 6' (1.829 m), weight 210 lb (95.3 kg), SpO2 100 %.     12/28/2021   11:16 AM 12/28/2021   10:59 AM 11/16/2021  10:03 AM  Vitals with BMI  Height  '6\' 0"'$    Weight  210 lbs   BMI  10.30   Systolic 131 438 887  Diastolic 74 82 76  Pulse  67     Physical Exam Neck:     Vascular: No carotid bruit or JVD.  Cardiovascular:     Rate and Rhythm: Normal rate and regular rhythm.     Pulses: Intact distal pulses.     Heart sounds: Normal heart sounds. No murmur heard.    No gallop.  Pulmonary:     Effort: Pulmonary effort is normal.     Breath sounds: Normal breath sounds.  Abdominal:     General: Bowel sounds are normal.     Palpations: Abdomen is soft.     Hernia: A hernia is present. Hernia is present in the ventral area.  Musculoskeletal:     Right lower leg: No edema.     Left lower leg: No edema.    Laboratory examination:   Recent Labs     12/25/21 1140  NA 140  K 4.8  CL 104  CO2 23  GLUCOSE 91  BUN 13  CREATININE 0.91  CALCIUM 9.7    estimated creatinine clearance is 80.1 mL/min (by C-G formula based on SCr of 0.91 mg/dL).     Latest Ref Rng & Units 12/25/2021   11:40 AM 05/05/2020    9:34 AM 11/25/2019   11:43 PM  CMP  Glucose 70 - 99 mg/dL 91  102  130   BUN 8 - 27 mg/dL '13  18  23   '$ Creatinine 0.76 - 1.27 mg/dL 0.91  0.90  1.41   Sodium 134 - 144 mmol/L 140  137  136   Potassium 3.5 - 5.2 mmol/L 4.8  4.8  4.2   Chloride 96 - 106 mmol/L 104  102  105   CO2 20 - 29 mmol/L '23  22  24   '$ Calcium 8.6 - 10.2 mg/dL 9.7  9.8  9.1   Total Protein 6.0 - 8.5 g/dL 6.7  6.9  6.8   Total Bilirubin 0.0 - 1.2 mg/dL 0.3  0.5  0.8   Alkaline Phos 44 - 121 IU/L 48  43  37   AST 0 - 40 IU/L '18  22  21   '$ ALT 0 - 44 IU/L '22  23  23       '$ Latest Ref Rng & Units 12/25/2021   11:40 AM 11/25/2019   11:43 PM 01/26/2019   11:19 AM  CBC  WBC 3.4 - 10.8 x10E3/uL 7.8  10.7  7.7   Hemoglobin 13.0 - 17.7 g/dL 14.5  12.9  14.2   Hematocrit 37.5 - 51.0 % 41.8  39.4  43.2   Platelets 150 - 450 x10E3/uL 304  250  292    Lipid Panel Recent Labs    12/25/21 1140  CHOL 258*  TRIG 133  LDLCALC 180*  HDL 54    Lab Results  Component Value Date   TSH 1.540 12/25/2021     Allergies  No Known Allergies   Final Medications at End of Visit     Current Outpatient Medications:    amLODipine (NORVASC) 5 MG tablet, Take 1 tablet (5 mg total) by mouth every morning., Disp: 90 tablet, Rfl: 3   aspirin EC 81 MG tablet, Take 81 mg by mouth 3 (three) times a week. Mondays, Wednesdays and Fridays, Disp: , Rfl:    flecainide (TAMBOCOR) 50  MG tablet, Take 1.5 tablets (75 mg total) by mouth daily., Disp: 135 tablet, Rfl: 3   omeprazole (PRILOSEC) 20 MG capsule, Take 20 mg by mouth daily., Disp: , Rfl:    rosuvastatin (CRESTOR) 20 MG tablet, Take 1 tablet (20 mg total) by mouth daily., Disp: 90 tablet, Rfl: 3   valsartan (DIOVAN) 320 MG  tablet, Take 1 tablet (320 mg total) by mouth every evening., Disp: 90 tablet, Rfl: 3    Radiology:  No results found.  Cardiac Studies:   Stress EKG 01/23/11: Normal stress EKG. ST dep back to baseline with exercise of 2.5 mm back to baseline at 1: 40 minutes into recovery. 7 minutes and 10 METs.   Echocardiogram 01/08/2017: Left ventricle cavity is normal in size. Mild concentric hypertrophy of the left ventricle. Normal global wall motion. Calculated EF 61%. Left atrial cavity is mildly dilated. Mild (Grade I) mitral regurgitation. No significant change compared to prior study in 2012.  Mobile cardiac telemetry 14 days 11/27/2019 - 12/11/2019: Dominant rhythm: Sinus with first degree AV block HR 45-144 bpm. Avg HR 66 bpm. 19 episodes of SVT, fastest at 126 bpm for 8 beats, longest for 12 beats at 102 bpm. <1% isolated SVE, couplet/triplets. <1% isolated VE, couplet/triplets. No atrial fibrillation/atrial flutter/VT/high grade AV block, sinus pause >3sec noted. 1 patient triggered event correlates with sinus rhythm/artifact  EKG:   EKG 12/28/2021: Normal sinus rhythm with first-degree AV block at rate of 67 bpm.  Left axis deviation.  IVCD, incomplete left bundle branch block.  Compared to previous EKG on 11/16/2021, no significant change.  Assessment     ICD-10-CM   1. Paroxysmal atrial fibrillation (HCC)  I48.0 EKG 12-Lead    2. Essential hypertension  I10     3. Pure hypercholesterolemia  E78.00 Lipid Panel With LDL/HDL Ratio      Meds ordered this encounter  Medications   rosuvastatin (CRESTOR) 20 MG tablet    Sig: Take 1 tablet (20 mg total) by mouth daily.    Dispense:  90 tablet    Refill:  3    Order Specific Question:   Supervising Provider    Answer:   Adrian Prows [[id number]]   Medications Discontinued During This Encounter  Medication Reason   simvastatin (ZOCOR) 40 MG tablet    This patients CHA2DS2-VASc Score 3 (HTN, Age) and yearly risk of stroke 3.2%.    Recommendations:   GEOFFERY AULTMAN  is a 76 y.o. Caucasian male with  h/o remote A. Fibrillation  In 2013 without recurrence and maintains sinus on Flecainide 50 mg BID chronically. He has had no known recurrence of A. fib since using flecainide.  Paroxysmal atrial fibrillation (HCC) EKG showed normal sinus rhythm with first-degree AV block, no changes from previous EKG. No recurrence of atrial fibrillation he remains on flecainide 75 mg daily. No currently on anticoagulation, Continues on Aspirin '81mg'$  daily  CHA2DS2-VASc Score is 3.  Yearly risk of stroke: 3.2% (3.2).  Score of 1=0.6; 2=2.2; 3=3.2; 4=4.8; 5=7.2; 6=9.8; 7=>9.8) -(CHF; HTN; vasc disease DM,  Male = 1; Age <65 =0; 65-74 = 1,  >75 =2; stroke/embolism= 2).   Essential hypertension Blood pressure was initially elevated at today's visit however repeat manual blood pressure check within normal range. He continues on amlodipine 5 mg daily and valsartan 320 mg daily.  Pure hypercholesterolemia Reviewed recent labs, LDL remains elevated.  We will stop simvastatin and start rosuvastatin 20 mg daily. We will recheck lipid panel in 6 months.  Discussed the importance of diet and exercise.  We will follow-up in 6 months to reassess lipid and blood pressure if controlled will see him on an annual basis.    Ernst Spell, AGNP-C 12/28/2021, 11:22 AM Office: [phone] Fax: [phone] Pager: [phone]

## 2022-02-19 ENCOUNTER — Ambulatory Visit: Payer: Medicare PPO | Admitting: Dermatology

## 2022-03-31 ENCOUNTER — Other Ambulatory Visit: Payer: Self-pay | Admitting: Cardiology

## 2022-03-31 DIAGNOSIS — I10 Essential (primary) hypertension: Secondary | ICD-10-CM

## 2022-04-03 ENCOUNTER — Telehealth: Payer: Self-pay

## 2022-04-03 DIAGNOSIS — I480 Paroxysmal atrial fibrillation: Secondary | ICD-10-CM

## 2022-04-03 NOTE — Telephone Encounter (Signed)
Patient is calling to get a refill of Flecainide, is this okay for [place] to fill.  Needs sent to [facility] on [address].

## 2022-04-04 MED ORDER — FLECAINIDE ACETATE 50 MG PO TABS: 75.0000 mg | ORAL_TABLET | Freq: Every day | ORAL | 3 refills | Status: DC

## 2022-04-04 NOTE — Telephone Encounter (Signed)
ICD-10-CM   1. Paroxysmal atrial fibrillation (HCC)  I48.0 flecainide (TAMBOCOR) 50 MG tablet     Meds ordered this encounter  Medications   flecainide (TAMBOCOR) 50 MG tablet    Sig: Take 1.5 tablets (75 mg total) by mouth daily.    Dispense:  135 tablet    Refill:  3    Medications Discontinued During This Encounter  Medication Reason   flecainide (TAMBOCOR) 50 MG tablet Reorder

## 2022-06-20 LAB — LIPID PANEL WITH LDL/HDL RATIO
Cholesterol, Total: 153 mg/dL (ref 100–199)
HDL: 55 mg/dL (ref >39)
LDL Chol Calc (NIH): 82 mg/dL (ref 0–99)
LDL/HDL Ratio: 1.5 ratio (ref 0.0–3.6)
Triglycerides: 82 mg/dL (ref 0–149)
VLDL Cholesterol Cal: 16 mg/dL (ref 5–40)

## 2022-06-28 ENCOUNTER — Ambulatory Visit: Payer: Medicare PPO | Admitting: Internal Medicine

## 2022-06-28 ENCOUNTER — Ambulatory Visit: Payer: Medicare PPO

## 2022-06-28 ENCOUNTER — Encounter: Payer: Self-pay | Admitting: Internal Medicine

## 2022-06-28 VITALS — BP 143/75 | HR 66 | Ht 72.0 in | Wt 214.2 lb

## 2022-06-28 DIAGNOSIS — I10 Essential (primary) hypertension: Secondary | ICD-10-CM

## 2022-06-28 DIAGNOSIS — E7800 Pure hypercholesterolemia, unspecified: Secondary | ICD-10-CM

## 2022-06-28 DIAGNOSIS — I480 Paroxysmal atrial fibrillation: Secondary | ICD-10-CM

## 2022-06-28 NOTE — Progress Notes (Signed)
No show

## 2022-06-28 NOTE — Progress Notes (Signed)
Primary Physician/Referring:  Barbie Banner, MD  Patient ID: [name], male    DOB: [birth year], 77 y.o.   MRN: [id number]  Chief Complaint  Patient presents with   Hypertension   Follow-up   Atrial Fibrillation   HPI:    [name]  is a 77 y.o. Caucasian male with  h/o remote A. Fibrillation  In 2013 without recurrence and maintains sinus on Flecainide 75 mg daily. He has had no known recurrence of A. fib since using flecainide.  He presents today for a follow-up visit. Patient states that he feels great. He is still playing tennis and does not have any issues in terms of shortness of breath or chest pain when he plays. He is very happy his cholesterol has significantly improved since switching to Crestor. He is tolerating his current medication regiment without issues or side effects. Overall, he says he feels wonderful. Denies chest pain, dizziness, palpitations, syncope, dyspnea.  Denies leg edema, orthopnea, PND.   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, 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   left upper arm - MOHs   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   upper mid back - MOHs   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HCC) 06/05/2005   left scapula - MOHs   Squamous cell carcinoma of skin 10/13/2003   right cheek - treated10/07/2003   Squamous cell carcinoma of skin 06/05/2005   left collarbone - MOHs   Squamous cell carcinoma of skin  12/15/2008   top right ear - CX3 + 5FU   Squamous cell carcinoma, face 03/01/2015   right cheek - CX3 + 5FU   Squamous cell carcinoma, face 03/01/2015   right side of nose - MOHs   Past Surgical History:  Procedure Laterality Date   APPENDECTOMY     Family History  Problem Relation Age of Onset   Heart attack Father    Social History   Tobacco Use   Smoking status: Former    Packs/day: 1.00    Years: 17.00    Additional pack years: 0.00    Total pack years: 17.00    Types: Cigarettes    Quit date: 07/02/1967    Years since quitting: 55.0   Smokeless tobacco: Former    Types: Chew    Quit date: 07/01/1969  Substance Use Topics   Alcohol use: Yes    Alcohol/week: 2.0 standard drinks of alcohol    Types: 2 Cans of beer per week    Comment: nightly  Married    ROS  Review of Systems  Cardiovascular:  Negative for chest pain, claudication, dyspnea on exertion, leg swelling, near-syncope, orthopnea, palpitations, paroxysmal nocturnal dyspnea and syncope.  Respiratory:  Negative for shortness of breath.   Hematologic/Lymphatic: Does not bruise/bleed easily.  Gastrointestinal:  Negative for melena.  Neurological:  Negative for dizziness and weakness.   Objective  Blood pressure Marland Kitchen)  143/75, pulse 66, height 6' (1.829 m), weight 214 lb 3.2 oz (97.2 kg), SpO2 98 %.     06/28/2022   11:08 AM 06/28/2022   11:00 AM 12/28/2021   11:16 AM  Vitals with BMI  Height  6\' 0"    Weight  214 lbs 3 oz   BMI  29.04   Systolic 143 164 098  Diastolic 75 75 74  Pulse 66 68     Physical Exam Neck:     Vascular: No carotid bruit or JVD.  Cardiovascular:     Rate and Rhythm: Normal rate and regular rhythm.     Pulses: Intact distal pulses.     Heart sounds: Normal heart sounds. No murmur heard.    No gallop.  Pulmonary:     Effort: Pulmonary effort is normal.     Breath sounds: Normal breath sounds.  Abdominal:     General: Bowel sounds are normal.     Palpations: Abdomen is soft.      Hernia: A hernia is present. Hernia is present in the ventral area.  Musculoskeletal:     Right lower leg: No edema.     Left lower leg: No edema.    Laboratory examination:   Recent Labs    12/25/21 1140  NA 140  K 4.8  CL 104  CO2 23  GLUCOSE 91  BUN 13  CREATININE 0.91  CALCIUM 9.7    CrCl cannot be calculated (Patient's most recent lab result is older than the maximum 21 days allowed.).     Latest Ref Rng & Units 12/25/2021   11:40 AM 05/05/2020    9:34 AM 11/25/2019   11:43 PM  CMP  Glucose 70 - 99 mg/dL 91  119  147   BUN 8 - 27 mg/dL 13  18  23    Creatinine 0.76 - 1.27 mg/dL 8.29  5.62  1.30   Sodium 134 - 144 mmol/L 140  137  136   Potassium 3.5 - 5.2 mmol/L 4.8  4.8  4.2   Chloride 96 - 106 mmol/L 104  102  105   CO2 20 - 29 mmol/L 23  22  24    Calcium 8.6 - 10.2 mg/dL 9.7  9.8  9.1   Total Protein 6.0 - 8.5 g/dL 6.7  6.9  6.8   Total Bilirubin 0.0 - 1.2 mg/dL 0.3  0.5  0.8   Alkaline Phos 44 - 121 IU/L 48  43  37   AST 0 - 40 IU/L 18  22  21    ALT 0 - 44 IU/L 22  23  23        Latest Ref Rng & Units 12/25/2021   11:40 AM 11/25/2019   11:43 PM 01/26/2019   11:19 AM  CBC  WBC 3.4 - 10.8 x10E3/uL 7.8  10.7  7.7   Hemoglobin 13.0 - 17.7 g/dL 86.5  78.4  69.6   Hematocrit 37.5 - 51.0 % 41.8  39.4  43.2   Platelets 150 - 450 x10E3/uL 304  250  292    Lipid Panel Recent Labs    12/25/21 1140 06/19/22 0856  CHOL 258* 153  TRIG 133 82  LDLCALC 180* 82  HDL 54 55    Lab Results  Component Value Date   TSH 1.540 12/25/2021     Allergies  No Known Allergies   Final Medications at End of Visit     Current Outpatient Medications:    amLODipine (NORVASC) 5 MG tablet, Take  1 tablet (5 mg total) by mouth every morning., Disp: 90 tablet, Rfl: 3   aspirin EC 81 MG tablet, Take 81 mg by mouth 3 (three) times a week. Mondays, Wednesdays and Fridays, Disp: , Rfl:    flecainide (TAMBOCOR) 50 MG tablet, Take 1.5 tablets (75 mg total) by mouth daily.,  Disp: 135 tablet, Rfl: 3   omeprazole (PRILOSEC) 20 MG capsule, Take 20 mg by mouth daily., Disp: , Rfl:    rosuvastatin (CRESTOR) 20 MG tablet, Take 1 tablet (20 mg total) by mouth daily., Disp: 90 tablet, Rfl: 3   valsartan (DIOVAN) 320 MG tablet, TAKE 1 TABLET BY MOUTH EVERY OTHER DAY AND 1/2 TABLET ON ALL OTHER DAYS, Disp: 70 tablet, Rfl: 3    Radiology:  No results found.  Cardiac Studies:   Stress EKG 01/23/11: Normal stress EKG. ST dep back to baseline with exercise of 2.5 mm back to baseline at 1: 40 minutes into recovery. 7 minutes and 10 METs.   Echocardiogram 01/08/2017: Left ventricle cavity is normal in size. Mild concentric hypertrophy of the left ventricle. Normal global wall motion. Calculated EF 61%. Left atrial cavity is mildly dilated. Mild (Grade I) mitral regurgitation. No significant change compared to prior study in 2012.  Mobile cardiac telemetry 14 days 11/27/2019 - 12/11/2019: Dominant rhythm: Sinus with first degree AV block HR 45-144 bpm. Avg HR 66 bpm. 19 episodes of SVT, fastest at 126 bpm for 8 beats, longest for 12 beats at 102 bpm. <1% isolated SVE, couplet/triplets. <1% isolated VE, couplet/triplets. No atrial fibrillation/atrial flutter/VT/high grade AV block, sinus pause >3sec noted. 1 patient triggered event correlates with sinus rhythm/artifact  EKG:   EKG 12/28/2021: Normal sinus rhythm with first-degree AV block at rate of 67 bpm.  Left axis deviation.  IVCD, incomplete left bundle branch block.  Compared to previous EKG on 11/16/2021, no significant change.  06/28/2022: Sinus Rhythm, rate 66 bpm. First degree A-V block with left bundle branch block. No significant change compared to prior  Assessment     ICD-10-CM   1. Pure hypercholesterolemia  E78.00     2. Paroxysmal atrial fibrillation (HCC)  I48.0 EKG 12-Lead    3. Essential hypertension  I10        No orders of the defined types were placed in this encounter.  There are no  discontinued medications.  This patients CHA2DS2-VASc Score 3 (HTN, Age) and yearly risk of stroke 3.2%.   Recommendations:   TALIN MCWHINNIE  is a 77 y.o. Caucasian male with  h/o remote A. Fibrillation  In 2013 without recurrence and maintains sinus on Flecainide 50 mg BID chronically. He has had no known recurrence of A. fib since using flecainide.  Paroxysmal atrial fibrillation (HCC) Continue flecainide 75 mg daily Not currently on anticoagulation Continues on Aspirin 81mg  daily  CHA2DS2-VASc Score is 3.  Yearly risk of stroke: 3.2% (3.2).  Score of 1=0.6; 2=2.2; 3=3.2; 4=4.8; 5=7.2; 6=9.8; 7=>9.8) -(CHF; HTN; vasc disease DM,  Male = 1; Age <65 =0; 65-74 = 1,  >75 =2; stroke/embolism= 2).   Essential hypertension Continue current cardiac medications. Encourage low-sodium diet, less than 2000 mg daily. He continues on amlodipine 5 mg daily and valsartan 320 mg daily. SBP slightly elevated however at home he runs <130/80   Pure hypercholesterolemia Continue rosuvastatin 20 mg daily. LDL Chol Calc (NIH) 0 - 99 mg/dL 82  LDL significantly improved (previously 180) Discussed the importance of diet and exercise.  We will follow-up in 6  months or sooner if needed    Clotilde Dieter, DO 06/28/2022, 11:22 AM Office: [phone] Fax: [phone] Pager: [phone]

## 2022-11-21 ENCOUNTER — Other Ambulatory Visit: Payer: Self-pay | Admitting: Cardiology

## 2022-11-21 DIAGNOSIS — I10 Essential (primary) hypertension: Secondary | ICD-10-CM

## 2022-12-16 NOTE — Progress Notes (Unsigned)
Cardiology Office Note:  .   Date:  12/17/2022  ID:  [name], DOB [birth year], MRN [id number] PCP: Barbie Banner, MD  [facility] Providers Cardiologist:  Yates Decamp, MD {  History of Present Illness: .   [name] is a 77 y.o. male with a past medical history of atrial fibrillation, hypertension, hyperlipidemia here for follow-up.  Had remote atrial fibrillation back in 2018 without recurrence and maintained sinus rhythm on flecainide 50 mg twice daily chronically.  No recurrence.  He was asymptomatic when he last saw Dr. Jacinto Halim 11/16/2021.  He denies chest pain, dizziness, palpitations, syncope, dyspnea, leg edema, orthopnea, and PND at that time.  Today, he tells me that he has a history of hypertension and atrial fibrillation, presents for a follow-up visit. He reports a recent episode of AFib that lasted for three days, which he managed by increasing his dose of flecainide from 75mg  daily (50mg  in the morning and 25mg  at night) to 100mg  daily (50mg  twice a day). The patient did not seek hospital care during this episode, choosing instead to rest and hydrate. He denies any known triggers for this episode.  In addition to the AFib, the patient has noticed swelling in his left ankle for the past 5-8 weeks. The swelling is not associated with pain or discomfort and typically resolves overnight. He denies any recent injuries or changes in activity level. The patient also has a history of hypertension, which is managed with valsartan and amlodipine. He has been monitoring his blood pressure at home and reports readings in the 130s over 71, which is significantly lower than the readings obtained in the office.He states that he has no energy when he has episodes of Afib.  Reports no shortness of breath nor dyspnea on exertion. Reports no chest pain, pressure, or tightness. No edema, orthopnea, PND.   Discussed the use of AI scribe software for clinical note transcription with the  patient, who gave verbal consent to proceed.   ROS: Pertinent ROS in HPI  Studies Reviewed: Marland Kitchen        Stress EKG 01/23/11: Normal stress EKG. ST dep back to baseline with exercise of 2.5 mm back to baseline at 1: 40 minutes into recovery. 7 minutes and 10 METs.    Echocardiogram 01/08/2017: Left ventricle cavity is normal in size. Mild concentric hypertrophy of the left ventricle. Normal global wall motion. Calculated EF 61%. Left atrial cavity is mildly dilated. Mild (Grade I) mitral regurgitation. No significant change compared to prior study in 2012.   Mobile cardiac telemetry 14 days 11/27/2019 - 12/11/2019: Dominant rhythm: Sinus with first degree AV block HR 45-144 bpm. Avg HR 66 bpm. 19 episodes of SVT, fastest at 126 bpm for 8 beats, longest for 12 beats at 102 bpm. <1% isolated SVE, couplet/triplets. <1% isolated VE, couplet/triplets. No atrial fibrillation/atrial flutter/VT/high grade AV block, sinus pause >3sec noted. 1 patient triggered event correlates with sinus rhythm/artifact   Risk Assessment/Calculations:    CHA2DS2-VASc Score = 3   This indicates a 3.2% annual risk of stroke. The patient's score is based upon: CHF History: 0 HTN History: 1 Diabetes History: 0 Stroke History: 0 Vascular Disease History: 0 Age Score: 2 Gender Score: 0        Physical Exam:   VS:  BP (!) 142/90 Comment: Left arm 130/90  Pulse 66   Ht 6' (1.829 m)   Wt 212 lb 6.4 oz (96.3 kg)   SpO2 100%   BMI  28.81 kg/m    Wt Readings from Last 3 Encounters:  12/17/22 212 lb 6.4 oz (96.3 kg)  06/28/22 214 lb 3.2 oz (97.2 kg)  12/28/21 210 lb (95.3 kg)    GEN: Well nourished, well developed in no acute distress NECK: No JVD; No carotid bruits CARDIAC: RRR, no murmurs, rubs, gallops RESPIRATORY:  Clear to auscultation without rales, wheezing or rhonchi  ABDOMEN: Soft, non-tender, non-distended EXTREMITIES:  No edema; No deformity   ASSESSMENT AND PLAN: .     HLD Continue  Crestor 20 mg daily -LDL 82, HDL 55, total cholesterol 914, triglycerides 82 (06/19/2022) -Will be due for repeat lipid panel in a year  Atrial Fibrillation Recent episode of AFib lasting three days, self-managed by increasing Flecainide dose. No hospitalization required. No known triggers. -Increase Flecainide to 50mg  twice daily. -EP consult -Check electrolytes today to rule out any imbalances that could contribute to AFib.  Hypertension Blood pressure readings inconsistent, with some elevated readings in the office but reportedly normal readings at home. Possible white coat syndrome. -Continue current regimen of Valsartan 320mg  every other day and Amlodipine. -Monitor blood pressure at home twice daily for two weeks, an hour after morning and evening medications. -recheck: -150/80 L, 158/80 R -Report readings via MyChart or phone call.  Edema Unilateral left ankle swelling noted for the past 5-6 weeks. No associated discomfort or other symptoms. -Observe for any worsening of swelling. -Consider adding HCTZ if swelling worsens or if blood pressure control is inadequate based on home readings.      Dispo: He can follow-up ina year with Dr. Jacinto Halim  Signed, Sharlene Dory, PA-C

## 2022-12-17 ENCOUNTER — Encounter: Payer: Self-pay | Admitting: Physician Assistant

## 2022-12-17 ENCOUNTER — Ambulatory Visit: Payer: Medicare PPO | Attending: Cardiology | Admitting: Physician Assistant

## 2022-12-17 VITALS — BP 142/90 | HR 66 | Ht 72.0 in | Wt 212.4 lb

## 2022-12-17 DIAGNOSIS — I10 Essential (primary) hypertension: Secondary | ICD-10-CM | POA: Diagnosis not present

## 2022-12-17 DIAGNOSIS — E785 Hyperlipidemia, unspecified: Secondary | ICD-10-CM | POA: Diagnosis not present

## 2022-12-17 DIAGNOSIS — I480 Paroxysmal atrial fibrillation: Secondary | ICD-10-CM | POA: Diagnosis not present

## 2022-12-17 MED ORDER — FLECAINIDE ACETATE 50 MG PO TABS: 50.0000 mg | ORAL_TABLET | Freq: Two times a day (BID) | ORAL | 3 refills | Status: DC

## 2022-12-17 NOTE — Patient Instructions (Signed)
Medication Instructions:  Your physician has recommended you make the following change in your medication:   INCREASE the Flecainide to 50 mg taking 1 twice a day  *If you need a refill on your cardiac medications before your next appointment, please call your pharmacy*   Lab Work: TODAY:  BMET,. MAG, & TSH  If you have labs (blood work) drawn today and your tests are completely normal, you will receive your results only by: MyChart Message (if you have MyChart) OR A paper copy in the mail If you have any lab test that is abnormal or we need to change your treatment, we will call you to review the results.   Testing/Procedures: None ordered  You have been referred to Electrophysiology for A-Fib   Follow-Up: At [facility], you and your health needs are our priority.  As part of our continuing mission to provide you with exceptional heart care, we have created designated Provider Care Teams.  These Care Teams include your primary Cardiologist (physician) and Advanced Practice Providers (APPs -  Physician Assistants and Nurse Practitioners) who all work together to provide you with the care you need, when you need it.  We recommend signing up for the patient portal called "MyChart".  Sign up information is provided on this After Visit Summary.  MyChart is used to connect with patients for Virtual Visits (Telemedicine).  Patients are able to view lab/test results, encounter notes, upcoming appointments, etc.  Non-urgent messages can be sent to your provider as well.   To learn more about what you can do with MyChart, go to [email or web address].    Your next appointment:   1 year(s)  Provider:   Yates Decamp, MD     Other Instructions Your physician has requested that you regularly monitor and record your blood pressure readings at home. Please use the same machine at the same time of day to check your readings and record them to bring to your follow-up visit.   Please  monitor blood pressures and keep a log of your readings for 2 weeks then send a message via mychart with the readings.    Make sure to check 1 hour after your morning meds and 1 hour after your evening meds    AVOID these things for 30 minutes before checking your blood pressure: No Drinking caffeine. No Drinking alcohol. No Eating. No Smoking. No Exercising.   Five minutes before checking your blood pressure: Pee. Sit in a dining chair. Avoid sitting in a soft couch or armchair. Be quiet. Do not talk

## 2022-12-18 LAB — BASIC METABOLIC PANEL
BUN/Creatinine Ratio: 16 (ref 10–24)
BUN: 13 mg/dL (ref 8–27)
CO2: 24 mmol/L (ref 20–29)
Calcium: 9.7 mg/dL (ref 8.6–10.2)
Chloride: 104 mmol/L (ref 96–106)
Creatinine, Ser: 0.81 mg/dL (ref 0.76–1.27)
Glucose: 97 mg/dL (ref 70–99)
Potassium: 4.9 mmol/L (ref 3.5–5.2)
Sodium: 139 mmol/L (ref 134–144)
eGFR: 90 mL/min/{1.73_m2} (ref >59)

## 2022-12-18 LAB — TSH: TSH: 1.55 u[IU]/mL (ref 0.450–4.500)

## 2022-12-18 LAB — MAGNESIUM: Magnesium: 2.4 mg/dL — ABNORMAL HIGH (ref 1.6–2.3)

## 2022-12-20 ENCOUNTER — Other Ambulatory Visit: Payer: Self-pay

## 2022-12-26 ENCOUNTER — Other Ambulatory Visit: Payer: Self-pay

## 2022-12-26 MED ORDER — ROSUVASTATIN CALCIUM 20 MG PO TABS: 20.0000 mg | ORAL_TABLET | Freq: Every day | ORAL | 3 refills | Status: DC

## 2022-12-27 ENCOUNTER — Ambulatory Visit: Payer: Self-pay | Admitting: Cardiology

## 2023-01-16 ENCOUNTER — Ambulatory Visit: Payer: Medicare PPO | Attending: Cardiovascular Disease | Admitting: Cardiovascular Disease

## 2023-01-16 ENCOUNTER — Encounter: Payer: Self-pay | Admitting: Cardiovascular Disease

## 2023-01-16 VITALS — BP 142/82 | HR 64 | Ht 72.0 in | Wt 212.0 lb

## 2023-01-16 DIAGNOSIS — I480 Paroxysmal atrial fibrillation: Secondary | ICD-10-CM | POA: Diagnosis not present

## 2023-01-16 MED ORDER — APIXABAN 5 MG PO TABS: 5.0000 mg | ORAL_TABLET | Freq: Two times a day (BID) | ORAL | 11 refills | Status: DC

## 2023-01-16 NOTE — Patient Instructions (Addendum)
Medication Instructions:  START Eliqius 5 mg twice daily  STOP Aspirin HOLD Flecainide for right now, will only resume if needed - keeping on medication list  *If you need a refill on your cardiac medications before your next appointment, please call your pharmacy*   Follow-Up: At [facility], you and your health needs are our priority.  As part of our continuing mission to provide you with exceptional heart care, we have created designated Provider Care Teams.  These Care Teams include your primary Cardiologist (physician) and Advanced Practice Providers (APPs -  Physician Assistants and Nurse Practitioners) who all work together to provide you with the care you need, when you need it.  We recommend signing up for the patient portal called "MyChart".  Sign up information is provided on this After Visit Summary.  MyChart is used to connect with patients for Virtual Visits (Telemedicine).  Patients are able to view lab/test results, encounter notes, upcoming appointments, etc.  Non-urgent messages can be sent to your provider as well.   To learn more about what you can do with MyChart, go to [email or web address].    Your next appointment:   3 month(s)  Provider:   York Pellant, MD   Other Instructions Please obtain an Alive Cor mobile device to monitor rhythm

## 2023-01-16 NOTE — Progress Notes (Signed)
Electrophysiology Office Note:    Date:  01/16/2023   ID:  [name], DOB 1945/08/28, MRN [id number]  PCP:  Barbie Banner, MD   [facility] Providers Cardiologist:  Yates Decamp, MD     Referring MD: Sharlene Dory, PA-C   History of Present Illness:    [name] is a 77 y.o. male with a medical history significant for atrial fibrillation, hypertension referred for arrhythmia management.     I discussed the use of AI scribe software for clinical note transcription with the patient, who gave verbal consent to proceed.  The patient, with a history of atrial fibrillation (AFib) diagnosed around age 39, presents for consultation regarding AFib management. The patient has been on flecainide 75mg  daily for several years without any recurrence of AFib. However, the patient reported an episode of AFib in August, which lasted for three days. The patient managed this episode by increasing the dose of flecainide from a total daily dose of 75mg  (50mg  in the morning and 25mg  in the evening) to 50mg  twice a day, which seemed to improve the AFib. The patient reports that these episodes are infrequent, with the last significant episode occurring two years ago. The patient describes feeling weak and lethargic during these episodes. The patient also has a history of left bundle branch block and is on valsartan for blood pressure control.     Today, he reports that he is doing well.  He has not had any palpitations recently.  His primary symptom of A-fib is fatigue, and he has not experienced this.  EKGs/Labs/Other Studies Reviewed Today:     Echocardiogram:  TTE 01/08/2017 EF 60%. Normal LV. Mild Grade I diastolic dysfunction   Monitors:  Zio monitor 14d 11/2019 -- my interpretation Sinus rhythm HR 45-144, avg 66 Ectopy < 1% Atrial runs occurred, longest 6.9 seconds   EKG:   EKG Interpretation Date/Time:  Wednesday January 16 2023 11:16:14 EST Ventricular Rate:  64 PR  Interval:  318 QRS Duration:  156 QT Interval:  440 QTC Calculation: 453 R Axis:   -17  Text Interpretation: Sinus rhythm with 1st degree A-V block Left bundle branch block When compared with ECG of 26-Nov-2019 05:19, No significant change was found Confirmed by York Pellant [phone]) on 01/16/2023 11:28:07 AM     Physical Exam:    VS:  BP (!) 164/82   Pulse 64   Ht 6' (1.829 m)   Wt 212 lb (96.2 kg)   SpO2 98%   BMI 28.75 kg/m     Wt Readings from Last 3 Encounters:  01/16/23 212 lb (96.2 kg)  12/17/22 212 lb 6.4 oz (96.3 kg)  06/28/22 214 lb 3.2 oz (97.2 kg)     GEN: Well nourished, well developed in no acute distress CARDIAC: RRR, no murmurs, rubs, gallops RESPIRATORY:  Normal work of breathing MUSCULOSKELETAL: no edema    ASSESSMENT & PLAN:     Atrial fibrillation Paroxysmal Symptomatic with fatigue Has done reasonably well with flecainide I am not comfortable prescribing flecainide due to left bundle branch block Plan: Patient will obtain an AliveCor monitor.  He will discontinue flecainide and monitor for reoccurrence of A-fib.  We discussed the ablation procedure, and he is very interested in this route though I would want documentation of his arrhythmia prior to scheduling it. If he has recurrence of AF, captures it on monitor, he may resume flecainide and we will arrange for ablation.  High risk medication - flecainide Would  prefer to DC this medication due to presence of left bundle branch block though he has tolerated well over several years He should be on a beta-blocker while on flecainide  Secondary hypercoagulable state CHA2DS2-VASc score is 3 Discontinue aspirin 81, start Eliquis 5 His AF burden is low at this point, but I think anticoagulation is warranted with discontinuation of flecainide pending future rhythm control efforts      Signed, Maurice Small, MD  01/16/2023 11:55 AM    [facility]

## 2023-03-19 ENCOUNTER — Telehealth: Payer: Self-pay | Admitting: Cardiology

## 2023-03-19 NOTE — Telephone Encounter (Signed)
 Pt c/o medication issue:  1. Name of Medication:   amLODipine  (NORVASC ) 5 MG tablet  valsartan  (DIOVAN ) 320 MG tablet   2. How are you currently taking this medication (dosage and times per day)?  As prescribed  3. Are you having a reaction (difficulty breathing--STAT)?   4. What is your medication issue?   Patient noted he had been taking the Amlodipine  in the morning and Valsartan , 320 mg at night.  Patient stated his left ankle has been swelling at night and he wants to know if he can stop taking the Amlodipine  and just take the Valsartan  every day instead.

## 2023-03-19 NOTE — Telephone Encounter (Signed)
 Left message to call office

## 2023-03-20 MED ORDER — VALSARTAN 320 MG PO TABS: 320.0000 mg | ORAL_TABLET | Freq: Every day | ORAL | 0 refills | Status: DC

## 2023-03-20 NOTE — Telephone Encounter (Signed)
 Patient notified.  He will stop amlodipine  and increase Valsartan  to 320 daily. He will with an update in 10 days on BP and swelling

## 2023-03-20 NOTE — Telephone Encounter (Signed)
 I spoke with patient.  He has swelling in his left ankle since October.  Discussed with Orren Fabry, PA at office visit in November. Since that visit the swelling has continued but no longer goes down over night.  Patient checked BP often in November and sent readings in (scanned under media tab).  Currently has only been checking BP every 2 weeks but states it is running similar to November readings.  Patient feels swelling is related to amlodipine .  He currently takes Valsartan  320 mg one tablet every other day and half tablet all other days.  He is asking if he should stop amlodipine  and take Valsartan  320 mg daily

## 2023-03-20 NOTE — Telephone Encounter (Signed)
 Yes he can increase the Valsartan  320 mg to taking it daily. Stop amlodipine  for 10 days and then try 1/2 tab (2.5mg ) if swelling has resolved and BP is still > 140/80 mm Hg

## 2023-03-27 ENCOUNTER — Other Ambulatory Visit: Payer: Self-pay

## 2023-03-27 MED ORDER — VALSARTAN 320 MG PO TABS: 320.0000 mg | ORAL_TABLET | Freq: Every day | ORAL | 0 refills | Status: DC

## 2023-03-27 NOTE — Telephone Encounter (Signed)
[facility] is requesting a refill on pt's medication valsartan 320 mg tablet. This medication was recently increased. Would Dr. Jacinto Halim like to refill this medication for a year? Please address

## 2023-03-27 NOTE — Telephone Encounter (Signed)
Refill sent to pharmacy.

## 2023-04-04 NOTE — Telephone Encounter (Signed)
Call placed to patient to follow up.  Left message to call office

## 2023-04-05 ENCOUNTER — Encounter: Payer: Self-pay | Admitting: Cardiology

## 2023-04-08 NOTE — Telephone Encounter (Signed)
 See my chart message

## 2023-04-22 ENCOUNTER — Encounter: Payer: Self-pay | Admitting: Cardiovascular Disease

## 2023-04-22 ENCOUNTER — Ambulatory Visit: Payer: Medicare PPO | Attending: Cardiovascular Disease | Admitting: Cardiovascular Disease

## 2023-04-22 VITALS — BP 138/70 | HR 80 | Ht 72.0 in | Wt 218.0 lb

## 2023-04-22 DIAGNOSIS — I480 Paroxysmal atrial fibrillation: Secondary | ICD-10-CM | POA: Diagnosis not present

## 2023-04-22 NOTE — Progress Notes (Signed)
 Electrophysiology Office Note:    Date:  04/22/2023   ID:  [name], DOB 1945-12-10, MRN [id number]  PCP:  Barbie Banner, MD   [facility] Providers Cardiologist:  Yates Decamp, MD Electrophysiologist:  Maurice Small, MD     Referring MD: Barbie Banner, MD   History of Present Illness:    [name] is a 78 y.o. male with a medical history significant for atrial fibrillation, hypertension referred for arrhythmia management.     I discussed the use of AI scribe software for clinical note transcription with the patient, who gave verbal consent to proceed.  The patient, with a history of atrial fibrillation (AFib) diagnosed around age 72, presents for consultation regarding AFib management. The patient has been on flecainide 75mg  daily for several years without any recurrence of AFib. However, the patient reported an episode of AFib in August, which lasted for three days. The patient managed this episode by increasing the dose of flecainide from a total daily dose of 75mg  (50mg  in the morning and 25mg  in the evening) to 50mg  twice a day, which seemed to improve the AFib. The patient reports that these episodes are infrequent, with the last significant episode occurring two years ago. The patient describes feeling weak and lethargic during these episodes. The patient also has a history of left bundle branch block and is on valsartan for blood pressure control.     Today, he reports that he is doing well off flecainide..  He has had a few palpitations which he senses as periodic skipped beats.  I reviewed AliveCor tracings which show rare PVCs.  Despite checking his rhythm frequently with the AliveCor, he has not had any tracings to suggest recurrence of atrial fibrillation.  EKGs/Labs/Other Studies Reviewed Today:     Echocardiogram:  TTE 01/08/2017 EF 60%. Normal LV. Mild Grade I diastolic dysfunction   Monitors:  Zio monitor 14d 11/2019 -- my  interpretation Sinus rhythm HR 45-144, avg 66 Ectopy < 1% Atrial runs occurred, longest 6.9 seconds   EKG:   EKG Interpretation Date/Time:  Monday April 22 2023 10:51:09 EDT Ventricular Rate:  80 PR Interval:  268 QRS Duration:  136 QT Interval:  406 QTC Calculation: 468 R Axis:   21  Text Interpretation: Sinus rhythm with 1st degree A-V block Non-specific intra-ventricular conduction block Minimal voltage criteria for LVH, may be normal variant ( Cornell product ) When compared with ECG of 16-Jan-2023 11:16, No significant change was found Confirmed by York Pellant [phone]) on 04/22/2023 11:12:27 AM     Physical Exam:    VS:  BP 138/70 (BP Location: Left Arm, Patient Position: Sitting, Cuff Size: Large)   Pulse 80   Ht 6' (1.829 m)   Wt 218 lb (98.9 kg)   SpO2 97%   BMI 29.57 kg/m     Wt Readings from Last 3 Encounters:  04/22/23 218 lb (98.9 kg)  01/16/23 212 lb (96.2 kg)  12/17/22 212 lb 6.4 oz (96.3 kg)     GEN: Well nourished, well developed in no acute distress CARDIAC: RRR, no murmurs, rubs, gallops RESPIRATORY:  Normal work of breathing MUSCULOSKELETAL: no edema    ASSESSMENT & PLAN:     Atrial fibrillation Paroxysmal Symptomatic with fatigue Flecainide has been discontinued, and he has not had symptoms to suggest recurrence.   High risk medication - flecainide Discontinued, maintaining sinus rhythm I would consider this medication contraindicated in the setting of left bundle branch block  Secondary hypercoagulable state CHA2DS2-VASc score is 3 Continue Eliquis 5 mg twice daily  PVCs Symptomatic with palpitations.  Burden appears low. Pt reassured I reviewed his kardia mobile tracings today   Signed, Maurice Small, MD  04/22/2023 11:22 AM    [facility]

## 2023-04-22 NOTE — Patient Instructions (Signed)
 Medication Instructions:  Your physician recommends that you continue on your current medications as directed. Please refer to the Current Medication list given to you today. *If you need a refill on your cardiac medications before your next appointment, please call your pharmacy*   Follow-Up: At [facility], you and your health needs are our priority.  As part of our continuing mission to provide you with exceptional heart care, we have created designated Provider Care Teams.  These Care Teams include your primary Cardiologist (physician) and Advanced Practice Providers (APPs -  Physician Assistants and Nurse Practitioners) who all work together to provide you with the care you need, when you need it.  We recommend signing up for the patient portal called "MyChart".  Sign up information is provided on this After Visit Summary.  MyChart is used to connect with patients for Virtual Visits (Telemedicine).  Patients are able to view lab/test results, encounter notes, upcoming appointments, etc.  Non-urgent messages can be sent to your provider as well.   To learn more about what you can do with MyChart, go to [email or web address].    Your next appointment:   1 year(s)  Provider:   York Pellant, MD

## 2023-06-05 ENCOUNTER — Telehealth: Payer: Self-pay | Admitting: Cardiovascular Disease

## 2023-06-05 ENCOUNTER — Ambulatory Visit: Attending: Internal Medicine | Admitting: *Deleted

## 2023-06-05 VITALS — BP 144/92 | HR 119 | Wt 218.2 lb

## 2023-06-05 DIAGNOSIS — I480 Paroxysmal atrial fibrillation: Secondary | ICD-10-CM | POA: Diagnosis not present

## 2023-06-05 MED ORDER — METOPROLOL SUCCINATE ER 25 MG PO TB24: 25.0000 mg | ORAL_TABLET | Freq: Every day | ORAL | 3 refills | Status: DC

## 2023-06-05 NOTE — Telephone Encounter (Signed)
 Didn't feel well this morning.  Hasn't felt well all day.  Has [facility] which showed he was going about 150 bpm and he thinks this has been going on for a several days.  Has been off flecainide  because Dr. Arlester Ladd didn't think he has had any afib.  Other than SOB he does not feel bad.  He is asking to come in for an EKG today to confirm what [facility] shows.  He is not able to upload it to the portal for review.  I scheduled him as a nurse visit for this afternoon.  I told him I did not recommend him driving.  His wife will bring him.

## 2023-06-05 NOTE — Progress Notes (Signed)
   Nurse Visit   Date of Encounter: 06/05/2023 ID: [name], DOB 01/04/1946, MRN [id number]  PCP:  Tura Gaines, MD   [facility] Providers Cardiologist:  Knox Perl, MD Electrophysiologist:  Efraim Grange, MD      Visit Details   VS:  BP (!) 144/92 (BP Location: Left Arm, Patient Position: Sitting, Cuff Size: Normal)   Pulse (!) 119   Wt 218 lb 3.2 oz (99 kg)   BMI 29.59 kg/m  , BMI Body mass index is 29.59 kg/m.  Wt Readings from Last 3 Encounters:  06/05/23 218 lb 3.2 oz (99 kg)  04/22/23 218 lb (98.9 kg)  01/16/23 212 lb (96.2 kg)     Reason for visit: EKG Performed today:   , Vitals, EKG, Provider consulted:Dr. Marven Slimmer, and Education Changes (medications, testing, etc.) : Start Toprol  XL 25 mg - take one tablet daily 4.   Length of Visit: 15 minutes    Medications Adjustments/Labs and Tests Ordered: Orders Placed This Encounter  Procedures   EKG 12-Lead   Meds ordered this encounter  Medications   metoprolol  succinate (TOPROL  XL) 25 MG 24 hr tablet    Sig: Take 1 tablet (25 mg total) by mouth daily.    Dispense:  90 tablet    Refill:  3   Reviewed with Dr. Marven Slimmer - stay off Flecainide . Start Toprol  XL 25 daily.  Follow up with afib clinic.  Appointment scheduled.  Wayne Haines, RN  06/05/2023 4:19 PM

## 2023-06-05 NOTE — Telephone Encounter (Signed)
 STAT if HR is under 50 or over 120  (normal HR is 60-100 beats per minute)  What is your heart rate? 150  Do you have a log of your heart rate readings (document readings)? No  Do you have any other symptoms? SOB and feels like can't catch a deep breath.

## 2023-06-05 NOTE — Patient Instructions (Signed)
 Medication Instructions:  Your physician has recommended you make the following change in your medication:  1.) start metoprolol  succinate (Toprol  XL) 25 - take one tablet daily  Lab Work: none   Testing/Procedures: none  Follow-Up: See below - as planned w afib clinic  Other Instructions       1st Floor: - Lobby - Registration  - Pharmacy  - Lab - Cafe  2nd Floor: - PV Lab - Diagnostic Testing (echo, CT, nuclear med)  3rd Floor: - Vacant  4th Floor: - TCTS (cardiothoracic surgery) - AFib Clinic - Structural Heart Clinic - Vascular Surgery  - Vascular Ultrasound  5th Floor: - HeartCare Cardiology (general and EP) - Clinical Pharmacy for coumadin, hypertension, lipid, weight-loss medications, and med management appointments    Valet parking services will be available as well.

## 2023-06-11 ENCOUNTER — Encounter (HOSPITAL_COMMUNITY): Payer: Self-pay | Admitting: Physician Assistant

## 2023-06-11 ENCOUNTER — Ambulatory Visit (HOSPITAL_COMMUNITY)
Admission: RE | Admit: 2023-06-11 | Discharge: 2023-06-11 | Disposition: A | Discharge status: Home / Self Care | Source: Ambulatory Visit | Attending: Physician Assistant | Admitting: Physician Assistant

## 2023-06-11 VITALS — BP 150/80 | HR 58 | Ht 72.0 in | Wt 215.8 lb

## 2023-06-11 DIAGNOSIS — D6869 Other thrombophilia: Secondary | ICD-10-CM

## 2023-06-11 DIAGNOSIS — I480 Paroxysmal atrial fibrillation: Secondary | ICD-10-CM | POA: Diagnosis not present

## 2023-06-11 NOTE — Progress Notes (Addendum)
 Primary Care Physician: Tura Gaines, MD Primary Cardiologist: Knox Perl, MD Electrophysiologist: Efraim Grange, MD  Referring Physician: Dr Jackelyn Marvel is a 78 y.o. male with a history of HTN, HLD, LBBB, atrial fibrillation who presents for follow up in the [facility].  The patient was initially diagnosed with atrial fibrillation remotely and had been maintained on flecainide . 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 Patient called the cardiology office reporting that he was back in afib. ECG 06/05/23 showed afib with RVR, he was started on Toprol  and referred to the AF clinic. Patient is on Eliquis  for stroke prevention.   Patient presents today for follow up for atrial fibrillation. He is in SR today. He has been in SR since starting metoprolol . He denies any bleeding issues on anticoagulation. There were no specific triggers for his afib episode.   Today, he denies symptoms of palpitations, chest pain, shortness of breath, orthopnea, PND, lower extremity edema, dizziness, presyncope, syncope, snoring, daytime somnolence, bleeding, or neurologic sequela. The patient is tolerating medications without difficulties and is otherwise without complaint today.    Atrial Fibrillation Risk Factors:  he does not have symptoms or diagnosis of sleep apnea. he does not have a history of rheumatic fever.   Atrial Fibrillation Management history:  Previous antiarrhythmic drugs: flecainide   Previous cardioversions: none Previous ablations: none Anticoagulation history: Eliquis   ROS- All systems are reviewed and negative except as per the HPI above.  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, 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   left upper arm - MOHs   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   upper mid back - MOHs   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HCC) 06/05/2005   left scapula - MOHs   Squamous cell carcinoma of skin 10/13/2003   right cheek - treated10/07/2003   Squamous cell carcinoma of skin 06/05/2005   left collarbone - MOHs   Squamous cell carcinoma of skin 12/15/2008   top right ear - CX3 + 5FU   Squamous cell carcinoma, 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 60 tablet 11   metoprolol  succinate (TOPROL  XL) 25 MG 24 hr tablet Take 1 tablet (25 mg total) by mouth daily. 90 tablet 3   omeprazole (PRILOSEC) 20 MG capsule Take 20 mg by mouth daily. Per patient taking 1/2 tablet 10mg      rosuvastatin  (CRESTOR ) 20 MG tablet Take 1 tablet (20 mg total) by mouth  daily. 90 tablet 3   valsartan  (DIOVAN ) 320 MG tablet Take 1 tablet (320 mg total) by mouth daily. 90 tablet 0   No current facility-administered medications for this encounter.    Physical Exam: BP (!) 150/80   Pulse (!) 58   Ht 6' (1.829 m)   Wt 97.9 kg   BMI 29.27 kg/m   GEN: Well nourished, well developed in no acute distress CARDIAC: Regular rate and rhythm, no murmurs, rubs, gallops RESPIRATORY:  Clear to auscultation without rales, wheezing or rhonchi  ABDOMEN: Soft, non-tender,  non-distended EXTREMITIES:  No edema; No deformity   Wt Readings from Last 3 Encounters:  06/11/23 97.9 kg  06/05/23 99 kg  04/22/23 98.9 kg     EKG today demonstrates  SB, 1st degree AV block, LBBB Vent. rate 58 BPM PR interval 284 ms QRS duration 134 ms QT/QTcB 416/408 ms    CHA2DS2-VASc Score = 3  The patient's score is based upon: CHF History: 0 HTN History: 1 Diabetes History: 0 Stroke History: 0 Vascular Disease History: 0 Age Score: 2 Gender Score: 0       ASSESSMENT AND PLAN: Paroxysmal Atrial Fibrillation (ICD10:  I48.0) The patient's CHA2DS2-VASc score is 3, indicating a 3.2% annual risk of stroke.   Patient in SR today. We discussed rhythm control options including dofetilide, amiodarone, and ablation. He would like to continue his current therapy for now. Would not resume class IC with his baseline conduction disease. If his afib does become more persistent, patient would favor ablation.  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  Continue Apixaban  (Eliquis ). No bleeding issues.   HTN Stable on current regimen    Follow up in the AF clinic in 3 months.        Myrtha Ates PA-C Afib Clinic [facility] [address] [place], [place] [address] [phone]

## 2023-09-10 ENCOUNTER — Encounter (HOSPITAL_COMMUNITY): Payer: Self-pay | Admitting: Physician Assistant

## 2023-09-10 ENCOUNTER — Ambulatory Visit (HOSPITAL_COMMUNITY)
Admission: RE | Admit: 2023-09-10 | Discharge: 2023-09-10 | Disposition: A | Discharge status: Home / Self Care | Source: Ambulatory Visit | Attending: Physician Assistant | Admitting: Physician Assistant

## 2023-09-10 VITALS — BP 142/80 | HR 54 | Ht 72.0 in | Wt 207.4 lb

## 2023-09-10 DIAGNOSIS — D6869 Other thrombophilia: Secondary | ICD-10-CM

## 2023-09-10 DIAGNOSIS — I480 Paroxysmal atrial fibrillation: Secondary | ICD-10-CM | POA: Diagnosis not present

## 2023-09-10 NOTE — Progress Notes (Signed)
 Primary Care Physician: Tanda Prentice DEL, MD Primary Cardiologist: Gordy Bergamo, MD Electrophysiologist: Eulas FORBES Furbish, MD  Referring Physician: Dr Cindie Lupita KANDICE [name] is a 78 y.o. male with a history of HTN, HLD, LBBB, atrial fibrillation who presents for follow up in the [facility].  The patient was initially diagnosed with atrial fibrillation remotely and had been maintained on flecainide . 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 Patient called the cardiology office reporting that he was back in afib. ECG 06/05/23 showed afib with RVR, he was started on Toprol  and referred to the AF clinic. Patient is on Eliquis  for stroke prevention.   Patient returns for follow up for atrial fibrillation. He reports that he has had three episodes since his last visit. Kardia mobile strips personally reviewed today which show true afib. There were no specific triggers that he could identify. He has symptoms of fatigue when out of rhythm. He admits today that he sometimes takes his Eliquis  only once per day.   Today, he  denies symptoms of chest pain, shortness of breath, orthopnea, PND, lower extremity edema, dizziness, presyncope, syncope, snoring, daytime somnolence, bleeding, or neurologic sequela. The patient is tolerating medications without difficulties and is otherwise without complaint today.    Atrial Fibrillation Risk Factors:  he does not have symptoms or diagnosis of sleep apnea. he does not have a history of rheumatic fever.   Atrial Fibrillation Management history:  Previous antiarrhythmic drugs: flecainide   Previous cardioversions: none Previous ablations: none Anticoagulation history: Eliquis   ROS- All systems are reviewed and negative except as per the HPI above.  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, 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   left upper arm - MOHs   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   upper mid back - MOHs   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HCC) 06/05/2005   left scapula - MOHs   Squamous cell carcinoma of skin 10/13/2003   right cheek - treated10/07/2003   Squamous cell carcinoma of skin 06/05/2005   left collarbone - MOHs   Squamous cell carcinoma of skin 12/15/2008   top right ear - CX3 + 5FU   Squamous cell carcinoma, 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 60 tablet 11   metoprolol  succinate (TOPROL  XL) 25 MG 24 hr tablet Take 1 tablet (25 mg total) by mouth daily. 90 tablet 3   omeprazole (PRILOSEC) 20 MG capsule Take 20 mg by mouth daily.  Per patient taking 1/2 tablet 10mg      rosuvastatin  (CRESTOR ) 20 MG tablet Take 1 tablet (20 mg total) by mouth daily. 90 tablet 3   valsartan  (DIOVAN ) 320 MG tablet Take 1 tablet (320 mg total) by mouth daily. 90 tablet 0   No current facility-administered medications for this encounter.    Physical Exam: BP (!) 142/80   Pulse (!) 54   Ht 6' (1.829 m)   Wt 94.1 kg   BMI 28.13 kg/m   GEN: Well nourished, well developed in no acute distress CARDIAC: Regular rate and rhythm, no murmurs,  rubs, gallops RESPIRATORY:  Clear to auscultation without rales, wheezing or rhonchi  ABDOMEN: Soft, non-tender, non-distended EXTREMITIES:  No edema; No deformity    Wt Readings from Last 3 Encounters:  09/10/23 94.1 kg  06/11/23 97.9 kg  06/05/23 99 kg     EKG today demonstrates  SB, 1st degree AV block, LBBB Vent. rate 54 BPM PR interval 308 ms QRS duration 134 ms QT/QTcB 424/402 ms    CHA2DS2-VASc Score = 3  The patient's score is based upon: CHF History: 0 HTN History: 1 Diabetes History: 0 Stroke History: 0 Vascular Disease History: 0 Age Score: 2 Gender Score: 0       ASSESSMENT AND PLAN: Paroxysmal Atrial Fibrillation (ICD10:  I48.0) The patient's CHA2DS2-VASc score is 3, indicating a 3.2% annual risk of stroke.   Patient in SR today but having more frequent afib.  We discussed rhythm control options including AAD and ablation. His AAD options are limited due to his baseline conduction disease. He is agreeable to discussing ablation with Dr Nancey, will refer. 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  Continue Apixaban  (Eliquis ). No bleeding issues.      HTN Stable on current regimen    Follow up with Dr Nancey to discuss ablation.       Daril Kicks PA-C Afib Clinic [facility] [address] [place], [place] [address] [phone]

## 2023-09-11 ENCOUNTER — Other Ambulatory Visit: Payer: Self-pay | Admitting: Cardiology

## 2023-12-21 ENCOUNTER — Other Ambulatory Visit: Payer: Self-pay | Admitting: Physician Assistant

## 2024-02-09 ENCOUNTER — Other Ambulatory Visit: Payer: Self-pay | Admitting: Cardiovascular Disease

## 2024-02-09 DIAGNOSIS — I480 Paroxysmal atrial fibrillation: Secondary | ICD-10-CM

## 2024-02-10 NOTE — Telephone Encounter (Signed)
 Prescription refill request for Eliquis  received. Indication:afib Last office visit:7/25 Drm:wzzid labs Age:  Weight: Prescription refilled

## 2024-02-15 LAB — BASIC METABOLIC PANEL WITH GFR
BUN/Creatinine Ratio: 15 (ref 10–24)
BUN: 17 mg/dL (ref 8–27)
CO2: 22 mmol/L (ref 20–29)
Calcium: 9.9 mg/dL (ref 8.6–10.2)
Chloride: 102 mmol/L (ref 96–106)
Creatinine, Ser: 1.1 mg/dL (ref 0.76–1.27)
Glucose: 91 mg/dL (ref 70–99)
Potassium: 4.9 mmol/L (ref 3.5–5.2)
Sodium: 139 mmol/L (ref 134–144)
eGFR: 68 mL/min/1.73

## 2024-02-15 LAB — CBC WITH DIFFERENTIAL/PLATELET
Basophils Absolute: 0.1 x10E3/uL (ref 0.0–0.2)
Basos: 1 %
EOS (ABSOLUTE): 0.1 x10E3/uL (ref 0.0–0.4)
Eos: 1 %
Hematocrit: 43.7 % (ref 37.5–51.0)
Hemoglobin: 14.1 g/dL (ref 13.0–17.7)
Immature Grans (Abs): 0.1 x10E3/uL (ref 0.0–0.1)
Immature Granulocytes: 1 %
Lymphocytes Absolute: 3.3 x10E3/uL — ABNORMAL HIGH (ref 0.7–3.1)
Lymphs: 38 %
MCH: 29.8 pg (ref 26.6–33.0)
MCHC: 32.3 g/dL (ref 31.5–35.7)
MCV: 92 fL (ref 79–97)
Monocytes Absolute: 0.7 x10E3/uL (ref 0.1–0.9)
Monocytes: 8 %
Neutrophils Absolute: 4.5 x10E3/uL (ref 1.4–7.0)
Neutrophils: 51 %
Platelets: 232 x10E3/uL (ref 150–450)
RBC: 4.73 x10E6/uL (ref 4.14–5.80)
RDW: 12.7 % (ref 11.6–15.4)
WBC: 8.7 x10E3/uL (ref 3.4–10.8)

## 2024-02-17 ENCOUNTER — Ambulatory Visit: Payer: Self-pay | Admitting: Cardiovascular Disease

## 2024-02-18 ENCOUNTER — Encounter: Payer: Self-pay | Admitting: Cardiology

## 2024-02-18 ENCOUNTER — Ambulatory Visit: Attending: Cardiology | Admitting: Cardiology

## 2024-02-18 VITALS — BP 142/72 | HR 54 | Ht 72.0 in | Wt 213.7 lb

## 2024-02-18 DIAGNOSIS — D6869 Other thrombophilia: Secondary | ICD-10-CM | POA: Diagnosis not present

## 2024-02-18 DIAGNOSIS — Z79899 Other long term (current) drug therapy: Secondary | ICD-10-CM

## 2024-02-18 DIAGNOSIS — E7800 Pure hypercholesterolemia, unspecified: Secondary | ICD-10-CM | POA: Diagnosis not present

## 2024-02-18 DIAGNOSIS — I480 Paroxysmal atrial fibrillation: Secondary | ICD-10-CM

## 2024-02-18 DIAGNOSIS — I10 Essential (primary) hypertension: Secondary | ICD-10-CM

## 2024-02-18 MED ORDER — VALSARTAN-HYDROCHLOROTHIAZIDE 320-12.5 MG PO TABS: 1.0000 | ORAL_TABLET | ORAL | 3 refills | Status: AC

## 2024-02-18 MED ORDER — METOPROLOL SUCCINATE ER 25 MG PO TB24: 25.0000 mg | ORAL_TABLET | Freq: Every day | ORAL | 3 refills | Status: AC

## 2024-02-18 MED ORDER — APIXABAN 5 MG PO TABS: 5.0000 mg | ORAL_TABLET | Freq: Two times a day (BID) | ORAL | 3 refills | Status: AC

## 2024-02-18 MED ORDER — ROSUVASTATIN CALCIUM 20 MG PO TABS: 20.0000 mg | ORAL_TABLET | Freq: Every day | ORAL | 3 refills | Status: AC

## 2024-02-18 NOTE — Progress Notes (Signed)
 " Cardiology Office Note:  .   Date:  02/18/2024  ID:  [name], DOB June 05, 1945, MRN [id number] PCP: Tanda Prentice DEL, MD  [facility] Providers Cardiologist:  Gordy Bergamo, MD Electrophysiologist:  Eulas FORBES Furbish, MD   History of Present Illness: .   [name] is a 79 y.o. male with a history of HTN, HLD, LBBB, atrial fibrillation who presents for follow up in the [facility].    The patient was initially diagnosed with atrial fibrillation remotely and had been maintained on flecainide . 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  Patient then developed recurrence of atrial fibrillation and was evaluated on 06/05/2023,  he was started on Toprol  and referred to the AF clinic. Patient is on Eliquis  for stroke prevention.    He has been evaluated by A-fib clinic and recommended EP evaluation for possible ablation.  He now presents for follow-up 51-month visit, last seen on 09/10/2023.  States that since being on metoprolol  succinate, he has not had any further episodes of atrial fibrillation and he also thinks alcohol may have precipitated A-fib and now he has reduced his alcohol intake significantly.    Discussed the use of AI scribe software for clinical note transcription with the patient, who gave verbal consent to proceed. History of Present Illness [name] is an 79 year old male with paroxysmal atrial fibrillation and hypertension who presents for medication management and follow-up. He was referred by [facility] for cardiovascular management.  He has been without rosuvastatin  for about a month due to difficulty renewing the prescription and not realizing he needed blood work and a follow-up visit. He requested a refill in late December but there was a delay.  He is taking metoprolol  with resting heart rates in the  50s. He takes valsartan  in the morning and has about 20 to 30 tablets remaining. He continues Eliquis , which recently increased in cost to about $40 per month from previously being free.  He stopped flecainide  and had episodes of atrial fibrillation shortly after, which he associates with alcohol intake. He has reduced alcohol to about one beer and one glass of wine daily but describes himself as a heavy drinker.  He had blood work on January 2nd that showed an elevated white blood cell count in the setting of a recent root canal and amoxicillin treatment. He did not see a cholesterol result in those labs.  He does not currently have a primary care physician after his previous doctor retired and has not seen [employer] for about six years.  Cardiac Studies relevent.    Echocardiogram 01/08/2017: Left ventricle cavity is normal in size. Mild concentric hypertrophy of the left ventricle. Normal global wall motion. Calculated EF 61%. Left atrial cavity is mildly dilated. Mild (Grade I) mitral regurgitation. No significant change compared to prior study in 2012.  EKG:  CERMSGREFRESH([id number],,,1)@  EKG 09/10/2023: Sinus rhythm with first-degree AV block at rate of 54 bpm, normal axis, IVCD, ILBBB.  No significant change dating back to 11/25/2019.  Labs   Lab Results  Component Value Date   CHOL 153 06/19/2022   HDL 55 06/19/2022   LDLCALC 82 06/19/2022   TRIG 82 06/19/2022   No results found for: LIPOA  Recent Labs    02/14/24 1005  NA 139  K 4.9  CL 102  CO2 22  GLUCOSE 91  BUN 17  CREATININE 1.10  CALCIUM  9.9    Lab Results  Component Value Date   ALT 22 12/25/2021   AST 18 12/25/2021   ALKPHOS 48 12/25/2021   BILITOT 0.3 12/25/2021      Latest Ref Rng & Units 02/14/2024   10:05 AM 12/25/2021   11:40 AM 11/25/2019   11:43 PM  CBC  WBC 3.4 - 10.8 x10E3/uL 8.7  7.8  10.7   Hemoglobin 13.0 - 17.7 g/dL 85.8  85.4  87.0   Hematocrit 37.5 - 51.0  % 43.7  41.8  39.4   Platelets 150 - 450 x10E3/uL 232  304  250    No results found for: HGBA1C  Lab Results  Component Value Date   TSH 1.550 12/17/2022     ROS  Review of Systems  Cardiovascular:  Negative for chest pain, dyspnea on exertion and leg swelling.   Physical Exam:   VS:  BP (!) 142/72 (BP Location: Left Arm, Patient Position: Sitting, Cuff Size: Normal)   Pulse (!) 54   Ht 6' (1.829 m)   Wt 213 lb 11.2 oz (96.9 kg)   SpO2 100%   BMI 28.98 kg/m    Wt Readings from Last 3 Encounters:  02/18/24 213 lb 11.2 oz (96.9 kg)  09/10/23 207 lb 6.4 oz (94.1 kg)  06/11/23 215 lb 12.8 oz (97.9 kg)    BP Readings from Last 3 Encounters:  02/18/24 (!) 142/72  09/10/23 (!) 142/80  06/11/23 (!) 150/80   Physical Exam Neck:     Vascular: No carotid bruit or JVD.  Cardiovascular:     Rate and Rhythm: Normal rate and regular rhythm.     Pulses: Intact distal pulses.     Heart sounds: Normal heart sounds. No murmur heard.    No gallop.  Pulmonary:     Effort: Pulmonary effort is normal.     Breath sounds: Normal breath sounds.  Abdominal:     General: Bowel sounds are normal.     Palpations: Abdomen is soft.  Musculoskeletal:     Right lower leg: No edema.     Left lower leg: No edema.    ASSESSMENT AND PLAN: .      ICD-10-CM   1. Paroxysmal atrial fibrillation (HCC)  I48.0     2. High risk medication use  Z79.899     3. Hypercoagulable state due to paroxysmal atrial fibrillation (HCC)  D68.69    I48.0     4. Essential hypertension  I10     5. Hyperlipidemia LDL goal <70  E78.5 rosuvastatin  (CRESTOR ) 20 MG tablet     Assessment & Plan Paroxysmal atrial fibrillation Well-controlled with metoprolol . No recent episodes since discontinuation of flecainide . Alcohol consumption identified as a trigger for episodes. - Continue metoprolol . - Advised reduction in alcohol consumption to prevent episodes.  Essential hypertension Hypertension is managed with  metoprolol  and valsartan . Blood pressure control is adequate, but a slight reduction is desired. Transition to valsartan  HCT 320/12.5 mg in the morning from plain valsartan  340 mg daily is planned to enhance blood pressure control with a mild diuretic effect. - Switched valsartan  to valsartan  HCT to enhance blood pressure control. - Continue metoprolol  succinate 25 mg daily. - Sent prescription for Eliquis  for 90 days.  Pure hypercholesterolemia Cholesterol levels were well-controlled last year. Rosuvastatin  was discontinued for a month due to prescription issues. Plan to reassess cholesterol levels in 3-4 weeks. -  Sent prescription for rosuvastatin  (Crestor ). - Will check cholesterol levels in 3-4 weeks with fasting blood work along with BMP in view of change in his ARB/diuretic.SABRA Patient has not seen his PCP for several years, I encouraged him to make an appointment as he needs routine annual physical and routine labs.  Reviewed his BMP and CBC, stable.  Follow up: 1 year, PAF, Hypertension, hyperlipdemia   Signed,  Gordy Bergamo, MD, [facility] 02/18/2024, 11:13 AM [facility] [address] [place], [place] [address] Phone: [phone]. Fax:  [phone]  "

## 2024-02-18 NOTE — Patient Instructions (Addendum)
 Medication Instructions:  START      apixaban  (ELIQUIS ) 5 MG  tablet         Take 1 tablet (5 mg total) by mouth 2 (two) times daily.     metoprolol  succinate (TOPROL  XL) 25 MG 24 hr tablet        Take 1 tablet (25 mg total) by mouth daily.     rosuvastatin  (CRESTOR ) 20 MG tablet        Take 1 tablet (20 mg total) by mouth daily.,      valsartan -hydrochlorothiazide  (DIOVAN -HCT) 320-12.5 MG tablet        Take 1 tablet by mouth every morning    *If you need a refill on your cardiac medications before your next appointment, please call your pharmacy*  Lab Work:  To be done in 3-4 weeks  Lab Orders         Lipid Panel With LDL/HDL Ratio         Basic metabolic panel with GFR     If you have labs (blood work) drawn today and your tests are completely normal, you will receive your results only by: MyChart Message (if you have MyChart) OR A paper copy in the mail If you have any lab test that is abnormal or we need to change your treatment, we will call you to review the results.   Follow-Up: At [facility], you and your health needs are our priority.  As part of our continuing mission to provide you with exceptional heart care, our providers are all part of one team.  This team includes your primary Cardiologist (physician) and Advanced Practice Providers or APPs (Physician Assistants and Nurse Practitioners) who all work together to provide you with the care you need, when you need it.  Your next appointment:   1 year(s)  Provider:   Gordy Bergamo, MD    We recommend signing up for the patient portal called MyChart.  Sign up information is provided on this After Visit Summary.  MyChart is used to connect with patients for Virtual Visits (Telemedicine).  Patients are able to view lab/test results, encounter notes, upcoming appointments, etc.  Non-urgent messages can be sent to your provider as well.   To learn more about what you can do with MyChart, go to  [email or web address].       We recommend signing up for the patient portal called MyChart.  Patients are able to view lab/test results, encounter notes, upcoming appointments, etc.  Non-urgent messages can be sent to your provider as well, go to [email or web address].   Blood work in 3-4 weeks at any [employer] of [facility].

## 2024-02-20 ENCOUNTER — Ambulatory Visit (HOSPITAL_COMMUNITY): Admitting: Physician Assistant

## 2024-03-06 LAB — BASIC METABOLIC PANEL WITH GFR
BUN/Creatinine Ratio: 20 (ref 10–24)
BUN: 20 mg/dL (ref 8–27)
CO2: 22 mmol/L (ref 20–29)
Calcium: 9.7 mg/dL (ref 8.6–10.2)
Chloride: 103 mmol/L (ref 96–106)
Creatinine, Ser: 1.01 mg/dL (ref 0.76–1.27)
Glucose: 105 mg/dL — ABNORMAL HIGH (ref 70–99)
Potassium: 4.9 mmol/L (ref 3.5–5.2)
Sodium: 140 mmol/L (ref 134–144)
eGFR: 75 mL/min/1.73

## 2024-03-06 LAB — LIPID PANEL WITH LDL/HDL RATIO
Cholesterol, Total: 159 mg/dL (ref 100–199)
HDL: 52 mg/dL
LDL Chol Calc (NIH): 92 mg/dL (ref 0–99)
LDL/HDL Ratio: 1.8 ratio (ref 0.0–3.6)
Triglycerides: 79 mg/dL (ref 0–149)
VLDL Cholesterol Cal: 15 mg/dL (ref 5–40)

## 2024-03-07 ENCOUNTER — Ambulatory Visit: Payer: Self-pay | Admitting: Cardiology
